# Patient Record
Sex: Male | Born: 1960
Health system: Southern US, Community
[De-identification: ages and names within clinical notes are randomized; demographics above are authoritative.]

## PROBLEM LIST (undated history)

## (undated) DIAGNOSIS — C439 Malignant melanoma of skin, unspecified: Secondary | ICD-10-CM

## (undated) DIAGNOSIS — M1990 Unspecified osteoarthritis, unspecified site: Secondary | ICD-10-CM

## (undated) DIAGNOSIS — N529 Male erectile dysfunction, unspecified: Secondary | ICD-10-CM

## (undated) DIAGNOSIS — C679 Malignant neoplasm of bladder, unspecified: Secondary | ICD-10-CM

## (undated) DIAGNOSIS — I10 Essential (primary) hypertension: Secondary | ICD-10-CM

## (undated) DIAGNOSIS — K219 Gastro-esophageal reflux disease without esophagitis: Secondary | ICD-10-CM

## (undated) DIAGNOSIS — G4730 Sleep apnea, unspecified: Secondary | ICD-10-CM

## (undated) DIAGNOSIS — N400 Enlarged prostate without lower urinary tract symptoms: Secondary | ICD-10-CM

## (undated) HISTORY — DX: Malignant melanoma of skin, unspecified: C43.9

## (undated) HISTORY — DX: Male erectile dysfunction, unspecified: N52.9

## (undated) HISTORY — DX: Sleep apnea, unspecified: G47.30

## (undated) HISTORY — DX: Essential (primary) hypertension: I10

## (undated) HISTORY — PX: KNEE SURGERY: SHX244

## (undated) HISTORY — DX: Benign prostatic hyperplasia without lower urinary tract symptoms: N40.0

## (undated) HISTORY — PX: TONSILLECTOMY AND ADENOIDECTOMY: SUR1326

## (undated) HISTORY — DX: Gastro-esophageal reflux disease without esophagitis: K21.9

## (undated) HISTORY — PX: PILONIDAL CYST EXCISION: SHX744

## (undated) HISTORY — DX: Malignant neoplasm of bladder, unspecified: C67.9

## (undated) HISTORY — PX: SHOULDER SURGERY: SHX246

## (undated) HISTORY — PX: MELANOMA EXCISION: SHX5266

---

## 2004-11-23 ENCOUNTER — Emergency Department (HOSPITAL_COMMUNITY): Admission: EM | Admit: 2004-11-23 | Discharge: 2004-11-23 | Payer: Self-pay | Admitting: Family Medicine

## 2005-09-21 DIAGNOSIS — C679 Malignant neoplasm of bladder, unspecified: Secondary | ICD-10-CM

## 2005-09-21 HISTORY — DX: Malignant neoplasm of bladder, unspecified: C67.9

## 2005-12-28 ENCOUNTER — Encounter (INDEPENDENT_AMBULATORY_CARE_PROVIDER_SITE_OTHER): Payer: Self-pay | Admitting: *Deleted

## 2005-12-28 ENCOUNTER — Ambulatory Visit (HOSPITAL_BASED_OUTPATIENT_CLINIC_OR_DEPARTMENT_OTHER): Admission: RE | Admit: 2005-12-28 | Discharge: 2005-12-28 | Payer: Self-pay | Admitting: Urology

## 2006-04-30 ENCOUNTER — Ambulatory Visit (HOSPITAL_BASED_OUTPATIENT_CLINIC_OR_DEPARTMENT_OTHER): Admission: RE | Admit: 2006-04-30 | Discharge: 2006-04-30 | Payer: Self-pay | Admitting: Urology

## 2006-04-30 ENCOUNTER — Encounter (INDEPENDENT_AMBULATORY_CARE_PROVIDER_SITE_OTHER): Payer: Self-pay | Admitting: Specialist

## 2007-08-01 ENCOUNTER — Ambulatory Visit (HOSPITAL_BASED_OUTPATIENT_CLINIC_OR_DEPARTMENT_OTHER): Admission: RE | Admit: 2007-08-01 | Discharge: 2007-08-01 | Payer: Self-pay | Admitting: Urology

## 2007-08-01 ENCOUNTER — Encounter (INDEPENDENT_AMBULATORY_CARE_PROVIDER_SITE_OTHER): Payer: Self-pay | Admitting: Urology

## 2008-07-20 ENCOUNTER — Encounter (INDEPENDENT_AMBULATORY_CARE_PROVIDER_SITE_OTHER): Payer: Self-pay | Admitting: Urology

## 2008-07-20 ENCOUNTER — Ambulatory Visit (HOSPITAL_BASED_OUTPATIENT_CLINIC_OR_DEPARTMENT_OTHER): Admission: RE | Admit: 2008-07-20 | Discharge: 2008-07-20 | Payer: Self-pay | Admitting: Urology

## 2009-09-03 ENCOUNTER — Ambulatory Visit (HOSPITAL_BASED_OUTPATIENT_CLINIC_OR_DEPARTMENT_OTHER): Admission: RE | Admit: 2009-09-03 | Discharge: 2009-09-03 | Payer: Self-pay | Admitting: Podiatry

## 2010-09-09 ENCOUNTER — Ambulatory Visit
Admission: RE | Admit: 2010-09-09 | Discharge: 2010-09-09 | Payer: Self-pay | Source: Home / Self Care | Attending: Urology | Admitting: Urology

## 2010-12-01 LAB — POCT HEMOGLOBIN-HEMACUE: Hemoglobin: 17.2 g/dL — ABNORMAL HIGH (ref 13.0–17.0)

## 2010-12-23 LAB — POCT HEMOGLOBIN-HEMACUE: Hemoglobin: 16 g/dL (ref 13.0–17.0)

## 2011-02-03 NOTE — Op Note (Signed)
NAMEGERMAIN, Christian Douglas                 ACCOUNT NO.:  [id number]   MEDICAL RECORD NO.:  [id number]          PATIENT TYPE:  AMB   LOCATION:  [facility]                         FACILITY:  [facility]   PHYSICIAN:  [name], M.D.DATE OF BIRTH:  1960/11/13   DATE OF PROCEDURE:  08/01/2007  DATE OF DISCHARGE:                               OPERATIVE REPORT   PREOPERATIVE DIAGNOSIS:  History of carcinoma in situ of the bladder.   POSTOPERATIVE DIAGNOSIS:  1. History of carcinoma in situ of the bladder.  2. Mild urethral meatal stenosis.   PROCEDURE:  Cystoscopy, urethral meatal dilation and cold cup bladder  biopsy.   SURGEON:  Dr. Larey Dresser.   ANESTHESIA:  General.   INDICATIONS:  This 50 year old gentleman has a history of carcinoma in  situ of the bladder discovered in April 2007.  He was then treated with  6 weeks of BCG and the biopsy of the bladder in August 2007 showed  reactive changes.  Follow-up cystoscopies have been unremarkable since  then. He comes in now for follow-up of bladder biopsy to rule out any  recurrent disease.   PROCEDURE:  The patient was brought to the operating room and placed in  lithotomy position. The external genitalia were prepped and draped in  the usual fashion.  He had a mild urethral meatal stenosis which was  dilated and after that the cystoscope was inserted and the anterior  urethra was normal and the prostatic urethra was unremarkable.  The  bladder actually looked quite good with no localized lesions, papillary  tumors or stones.  Random bladder biopsies were taken across the bladder  base and the biopsy sites fulgurated with the Bugbee electrode.  The  bladder was emptied, the scope removed and the patient sent to the  recovery room in good condition having tolerated the procedure well.  Blood loss was zero.      [name], M.D.  Electronically Signed     LJP/MEDQ  D:  08/01/2007  T:  08/01/2007  Job:  [id number]

## 2011-02-03 NOTE — Op Note (Signed)
NAMEEAVEN, SCHWAGER                 ACCOUNT NO.:  [id number]   MEDICAL RECORD NO.:  [id number]          PATIENT TYPE:  AMB   LOCATION:  [facility]                         FACILITY:  [facility]   PHYSICIAN:  [name], M.D.DATE OF BIRTH:  [birth year]   DATE OF PROCEDURE:  07/20/2008  DATE OF DISCHARGE:                               OPERATIVE REPORT   PREOPERATIVE DIAGNOSES:  History of bladder carcinoma.   POSTOPERATIVE DIAGNOSES:  History of bladder carcinoma.   PROCEDURE:  Cystoscopy and cold cup bladder biopsy.   SURGEON:  [name], M.D.   ANESTHESIA:  General.   INDICATIONS:  This gentleman has had bladder carcinoma in situ, going  back to April 2007.  After that, he had six weeks of BCG and a biopsy in  August 2007 showed reactive changes and also in November 2008.  He is  now back in for surveillance cystoscopy and biopsy of the bladder.   DESCRIPTION OF PROCEDURE:  The patient was brought to the operating room  and placed in lithotomy position.  The external genitalia were prepped  and draped in the usual fashion.  He was cystoscoped and there were no  anterior prostatic urethral lesions.  The bladder had just some mild  diffuse increased vascularity with no localized lesions.  There was  scarring from his previous biopsy site.  Ureteral orifices were normal.  Cold cup bladder biopsies were taken across the lower lateral walls and  bladder base and sent to pathology.  The biopsy sites were fulgurated  with the Bugbee electrode.  The bladder was emptied, the scope removed  and the patient sent to the recovery room in good condition, having  tolerated the procedure well.      [name], M.D.  Electronically Signed     LJP/MEDQ  D:  07/20/2008  T:  07/20/2008  Job:  [id number]

## 2011-02-06 NOTE — Op Note (Signed)
NAMESAAGAR, TORTORELLA                 ACCOUNT NO.:  [id number]   MEDICAL RECORD NO.:  [id number]          PATIENT TYPE:  AMB   LOCATION:  [facility]                         FACILITY:  [facility]   PHYSICIAN:  [name], M.D.DATE OF BIRTH:  1961/03/19   DATE OF PROCEDURE:  04/30/2006  DATE OF DISCHARGE:                                 OPERATIVE REPORT   PREOPERATIVE DIAGNOSIS:  History of carcinoma-in-situ of the bladder.   POSTOPERATIVE DIAGNOSIS:  1. History of carcinoma-in-situ of the bladder.  2. Urethral meatal stenosis.   PROCEDURE:  1. Cystoscopy.  2. Urethral dilation.  3. Cold cut bladder biopsy.   SURGEON:  [name], M.D.   ANESTHESIA:  General.   INDICATIONS:  This is a 50 year old gentleman who was found to have  carcinoma-in-situ in April of this year and then underwent 6 weeks of  therapy with BCG and he comes back now for cystoscopy and bladder biopsy to  assess the adequacy of therapy.   PROCEDURE:  The patient was brought to the operating room, placed in the  lithotomy position.  External genitalia were prepped and draped in the usual  fashion.  His urethra meatal area was somewhat tight so I had to perform a  urethral meatal dilation to 28 [place] and then the cystoscope was inserted  without difficulty.  The anterior and prostatic urethras were unremarkable.  The bladder had minimal erythema across the bladder base and no other  abnormalities and no papillary tumors.  No stones were seen.  Cold cut  bladder biopsy forceps was utilized to obtain three random biopsies across  the bladder base and the biopsy sites were then fulgurated with the Bugbee  electrode.  Bladder was emptied, the scope removed and the patient sent to  the recovery room in good condition having tolerated the procedure well with  essentially no blood loss.      [name], M.D.  Electronically Signed     LJP/MEDQ  D:  04/30/2006  T:  04/30/2006  Job:  [id number]

## 2011-02-06 NOTE — Op Note (Signed)
Christian Douglas, Christian Douglas                 ACCOUNT NO.:  [id number]   MEDICAL RECORD NO.:  [id number]          PATIENT TYPE:  AMB   LOCATION:  [facility]                         FACILITY:  [facility]   PHYSICIAN:  [name], M.D.DATE OF BIRTH:  11/28/1960   DATE OF PROCEDURE:  12/28/2005  DATE OF DISCHARGE:                                 OPERATIVE REPORT   PREOPERATIVE DIAGNOSIS:  Microhematuria.   POSTOPERATIVE DIAGNOSIS:  Microhematuria.   PROCEDURE:  Cystoscopy, bilateral retrograde pyelograms with interpretation.  Cold cut bladder biopsy.   SURGEON:  Dr. Larey Dresser   ANESTHESIA:  General.   INDICATIONS:  This 50 year old gentleman has had micro and gross hematuria  and has undergone cystoscopy that revealed some erythematous area on the  bladder that was felt to be some hemorrhagic cystitis at the time. He  returned for follow-up and was still having microhematuria.  I should add  that last year he did have a normal renal ultrasound.  It was felt that he  needed cystoscopy at this point along with possible bladder biopsy and  retrograde pyelograms for completion of his workup.   PROCEDURE:  The patient was brought to the operating room, placed lithotomy  position and external genitalia were prepped and draped in usual fashion.  He was cystoscoped and a mild urethral meatal stricture was dilated. The  anterior urethra was otherwise normal. The prostate was unremarkable.  The  bladder had just a little increased erythema on the bladder base with no  localized lesions. I should add that he did have a few tiny inflammatory  polyps in the prostatic urethra.  There was no acute inflammation and no  papillary tumors.   Bilateral retrograde pyelograms were obtained using a cone-tipped ureteral  catheter inserted cytoscopically and he had no obstruction or filling  defects. Pyelocaliceal systems were normal on the right side at first there  was an air bubble that was cleared with  further injection of contrast and  fluoroscopic visualization and in summary the upper tracts looked normal.   Cold cut bladder biopsies were taken from the bladder base and the biopsy  sites fulgurated with the Bugbee electrode. At this point the bladder was  emptied, scope removed.  The patient sent to recovery room in good condition  having tolerated the procedure well.      [name], M.D.  Electronically Signed     LJP/MEDQ  D:  12/28/2005  T:  12/28/2005  Job:  [id number]

## 2011-06-30 LAB — POCT HEMOGLOBIN-HEMACUE: Operator id: 268271

## 2012-01-13 ENCOUNTER — Ambulatory Visit (HOSPITAL_COMMUNITY)
Admission: RE | Admit: 2012-01-13 | Discharge: 2012-01-13 | Disposition: A | Discharge status: Home / Self Care | Payer: 59 | Source: Ambulatory Visit | Attending: Specialist | Admitting: Specialist

## 2012-01-13 ENCOUNTER — Other Ambulatory Visit (HOSPITAL_COMMUNITY): Payer: Self-pay | Admitting: Specialist

## 2012-01-13 DIAGNOSIS — Z139 Encounter for screening, unspecified: Secondary | ICD-10-CM

## 2012-01-13 DIAGNOSIS — Z1389 Encounter for screening for other disorder: Secondary | ICD-10-CM | POA: Insufficient documentation

## 2012-06-20 ENCOUNTER — Ambulatory Visit (HOSPITAL_COMMUNITY)
Admission: RE | Admit: 2012-06-20 | Discharge: 2012-06-20 | Disposition: A | Discharge status: Home / Self Care | Payer: 59 | Source: Ambulatory Visit | Attending: Specialist | Admitting: Specialist

## 2012-06-20 ENCOUNTER — Other Ambulatory Visit (HOSPITAL_COMMUNITY): Payer: Self-pay | Admitting: Lab

## 2012-06-20 DIAGNOSIS — M25569 Pain in unspecified knee: Secondary | ICD-10-CM

## 2012-06-20 DIAGNOSIS — Z1389 Encounter for screening for other disorder: Secondary | ICD-10-CM | POA: Insufficient documentation

## 2013-10-16 ENCOUNTER — Other Ambulatory Visit: Payer: Self-pay | Admitting: Neurological Surgery

## 2013-10-16 DIAGNOSIS — M5412 Radiculopathy, cervical region: Secondary | ICD-10-CM

## 2013-10-24 ENCOUNTER — Ambulatory Visit
Admission: RE | Admit: 2013-10-24 | Discharge: 2013-10-24 | Disposition: A | Discharge status: Home / Self Care | Payer: 59 | Source: Ambulatory Visit | Attending: Neurological Surgery | Admitting: Neurological Surgery

## 2013-10-24 DIAGNOSIS — M5412 Radiculopathy, cervical region: Secondary | ICD-10-CM

## 2014-08-28 ENCOUNTER — Ambulatory Visit (HOSPITAL_COMMUNITY)
Admission: RE | Admit: 2014-08-28 | Discharge: 2014-08-28 | Disposition: A | Discharge status: Home / Self Care | Payer: 59 | Source: Ambulatory Visit | Attending: Specialist | Admitting: Specialist

## 2014-08-28 ENCOUNTER — Other Ambulatory Visit (HOSPITAL_COMMUNITY): Payer: Self-pay | Admitting: Specialist

## 2014-08-28 DIAGNOSIS — M25561 Pain in right knee: Secondary | ICD-10-CM | POA: Insufficient documentation

## 2014-08-28 DIAGNOSIS — Z01818 Encounter for other preprocedural examination: Secondary | ICD-10-CM | POA: Diagnosis present

## 2015-02-25 ENCOUNTER — Telehealth: Payer: Self-pay

## 2015-02-25 NOTE — Telephone Encounter (Signed)
I called and left message for patient to see if he can move his appt to 8:30 on Friday due to a family event for Dr. Rexene Alberts. I gave him my name and number for Jovanne to call back.

## 2015-02-25 NOTE — Telephone Encounter (Signed)
Patient called/retruning Christian Douglas's call. Please call and advise. Patient can be reached @ [phone]

## 2015-02-25 NOTE — Telephone Encounter (Signed)
Patient called back but I was unable to catch his call. I left message to call back.

## 2015-02-25 NOTE — Telephone Encounter (Signed)
Patient called back and was connected with Christian Douglas.

## 2015-02-26 NOTE — Telephone Encounter (Signed)
I spoke to patient, he may try to come in a little early on Friday.

## 2015-03-01 ENCOUNTER — Ambulatory Visit (INDEPENDENT_AMBULATORY_CARE_PROVIDER_SITE_OTHER): Payer: 59 | Admitting: Neurology

## 2015-03-01 ENCOUNTER — Encounter: Payer: Self-pay | Admitting: Neurology

## 2015-03-01 VITALS — BP 132/82 | HR 88 | Resp 18 | Ht 71.0 in | Wt 336.0 lb

## 2015-03-01 DIAGNOSIS — G2581 Restless legs syndrome: Secondary | ICD-10-CM | POA: Diagnosis not present

## 2015-03-01 DIAGNOSIS — R351 Nocturia: Secondary | ICD-10-CM

## 2015-03-01 DIAGNOSIS — G4734 Idiopathic sleep related nonobstructive alveolar hypoventilation: Secondary | ICD-10-CM | POA: Diagnosis not present

## 2015-03-01 DIAGNOSIS — G4733 Obstructive sleep apnea (adult) (pediatric): Secondary | ICD-10-CM

## 2015-03-01 DIAGNOSIS — G4761 Periodic limb movement disorder: Secondary | ICD-10-CM

## 2015-03-01 NOTE — Progress Notes (Signed)
Subjective:    Patient ID: Christian Douglas is a 54 y.o. male.  HPI     Christian Age, MD, PhD [facility] [address] [place], [place] [address]  Dear Christian Douglas,   I saw your patient, Christian Douglas, upon your kind request in my neurologic clinic today for initial consultation of his sleep disorder, in particular, reevaluation of his obstructive sleep apnea. The patient is unaccompanied today. As you know, Christian Douglas is a 54 year old right-handed gentleman with an underlying medical history of morbid obesity, hypertension, reflux disease, BPH, erectile dysfunction, history of melanoma, history of bladder cancer, diagnosed in 2007, status post BCG treatment, status post tonsillectomy, adenoidectomy and pilonidal cyst removal, who was diagnosed with obstructive sleep apnea several years ago. I reviewed your office date from 02/04/2015 which you kindly included. I reviewed his prior sleep test reports as well: He had a baseline sleep study on 08/31/2008 at the [facility], which I reviewed: His AHI was 88 per hour. Average oxygen saturation was 86, nadir was 65. He was noted to have PVCs. Snoring was rated as severe. He had no significant PLMS. Sleep efficiency was 82%. He had a CPAP titration study at the [facility] on 09/18/2008, which I reviewed: CPAP was was titrated to 15 cm. His AHI was reduced to 0 per hour. He had no significant PLMS. Sleep efficiency was 68%. He was provided with a large nasal mask. BMI at the time was 44. He then had a BiPAP titration study at the [facility] on 06/14/2009, which I reviewed. He was titrated on BiPAP up to a pressure of 22/18. His residual AHI was 4.2 per hour at the final pressure. He had no significant PLMS. Sleep efficiency was 75%. He was provided with a fit life full facemask size large. All his 3 sleep studies were interpreted by Christian Douglas. His bedtime and  wake time varies. He works 12 hour shifts and these shifts can vary. Most of his shifts are daytime hours but he can have some nighttime shifts. He often works 7 days a week. He is a never smoker. He quit drinking alcohol in 1999. He drinks 1-2 cans of soda. His Epworth sleepiness score is 12 out of 24. His weight has been fluctuating. He is trying to lose weight and has started drinking more water. He has had some nighttime leg cramps. He does have some restless leg symptoms and his girlfriend says that he twitches and kicks in his sleep at times. He has to get up to use the bathroom approximately once per night. He does not have morning headaches. He lives alone but girlfriend stays over. His parents are in their 78s. He has no obvious family history of obstructive sleep apnea. His main symptom with regards to his sleep is nonrestorative sleep and daytime tiredness as well as inability to tolerate BiPAP at this higher pressure. He is currently using nasal pillows. He did not like the full facemask. A nasal mask was difficult to get a seal on because of his facial hair. He uses BiPAP regularly but typically does not tolerate it for more than 2-1/2-3 hours.  His Past Medical History Is Significant For: Past Medical History  Diagnosis Date  . Sleep apnea   . Hypertension   . GERD (gastroesophageal reflux disease)   . Benign prostatic hypertrophy   . ED (erectile dysfunction)   . Melanoma   . Bladder  cancer 2007    His Past Surgical History Is Significant For: Past Surgical History  Procedure Laterality Date  . Tonsillectomy and adenoidectomy    . Pilonidal cyst excision      His Family History Is Significant For: Family History  Problem Relation Douglas of Onset  . Osteoarthritis Mother   . Atrial fibrillation Father   . CAD Father   . Diabetes Sister   . Osteoarthritis Brother     His Social History Is Significant For: History   Social History  . Marital Status: Divorced    Spouse  Name: N/A  . Number of Children: 0  . Years of Education: College   Occupational History  . ADJUSTER     [employer]   Social History Main Topics  . Smoking status: Never Smoker   . Smokeless tobacco: Not on file  . Alcohol Use: No  . Drug Use: No  . Sexual Activity: Not on file   Other Topics Concern  . None   Social History Narrative   Consumes 2 sodas a day     His Allergies Are:  Allergies  Allergen Reactions  . Nasonex [Mometasone Furoate]   :   His Current Medications Are:  Outpatient Encounter Prescriptions as of 03/01/2015  Medication Sig  . calcium-vitamin D (OSCAL WITH D) 250-125 MG-UNIT per tablet Take 1 tablet by mouth daily.  . Garlic 10 MG CAPS Take by mouth.  Marland Kitchen HYDROcodone-acetaminophen (NORCO/VICODIN) 5-325 MG per tablet Take 1 tablet by mouth every 4 (four) hours as needed for moderate pain.  . magnesium 30 MG tablet Take 30 mg by mouth 2 (two) times daily.  . Multiple Vitamin (MULTIVITAMIN) capsule Take 1 capsule by mouth daily.  . pantoprazole (PROTONIX) 40 MG tablet Take 40 mg by mouth daily.  . tadalafil (CIALIS) 20 MG tablet Take 20 mg by mouth daily as needed for erectile dysfunction.  . vitamin B-12 (CYANOCOBALAMIN) 100 MCG tablet Take 100 mcg by mouth daily.   No facility-administered encounter medications on file as of 03/01/2015.  :  Review of Systems:  Out of a complete 14 point review of systems, all are reviewed and negative with the exception of these symptoms as listed below:   Review of Systems  Constitutional: Positive for fatigue.       Weight gain   Neurological:       Works 12hrs 7 days a week, Restless legs, Snoring, witnessed apnea, home pulse ox reported 70% during sleep, wakes up feeling tired in the morning, daytime tiredness, falls asleep when sitting down. No trouble falling asleep, states he has trouble staying asleep. Placed on CPAP around 2010, stopped using it for 2-3 years, restarted about 4-6 weeks ago.     Objective:   Neurologic Exam  Physical Exam Physical Examination:   Filed Vitals:   03/01/15 0900  BP: 132/82  Pulse: 88  Resp: 18    General Examination: The patient is a very pleasant 54 y.o. male in no acute distress. He appears well-developed and well-nourished and well groomed.   HEENT: Normocephalic, atraumatic, pupils are equal, round and reactive to light and accommodation. Funduscopic exam is normal with sharp disc margins noted. Extraocular tracking is good without limitation to gaze excursion or nystagmus noted. Normal smooth pursuit is noted. Hearing is grossly intact. Tympanic membranes are clear bilaterally. Face is symmetric with normal facial animation and normal facial sensation. Speech is clear with no dysarthria noted. There is no hypophonia. There is no lip, neck/head,  jaw or voice tremor. Neck is supple with full range of passive and active motion. There are no carotid bruits on auscultation. Oropharynx exam reveals: mild mouth dryness, adequate dental hygiene and moderate airway crowding, due to thicker soft palate, large uvula and larger tongue. Tonsils are absent. Mallampati is class III. Tongue protrudes centrally and palate elevates symmetrically. Neck size is 18 1/8 inches. He has a Absent overbite. Nasal inspection reveals no significant nasal mucosal bogginess or redness and no septal deviation.   Chest: Clear to auscultation without wheezing, rhonchi or crackles noted.  Heart: S1+S2+0, regular and normal without murmurs, rubs or gallops noted.   Abdomen: Soft, non-tender and non-distended with normal bowel sounds appreciated on auscultation.  Extremities: There is no pitting edema in the distal lower extremities bilaterally. Pedal pulses are intact.  Skin: Warm and dry without trophic changes noted. There are no varicose veins.  Musculoskeletal: exam reveals no obvious joint deformities, tenderness or joint swelling or erythema.   Neurologically:  Mental status: The  patient is awake, alert and oriented in all 4 spheres. His immediate and remote memory, attention, language skills and fund of knowledge are appropriate. There is no evidence of aphasia, agnosia, apraxia or anomia. Speech is clear with normal prosody and enunciation. Thought process is linear. Mood is normal and affect is normal.  Cranial nerves II - XII are as described above under HEENT exam. In addition: shoulder shrug is normal with equal shoulder height noted. Motor exam: Normal bulk, strength and tone is noted. There is no drift, tremor or rebound. Romberg is negative. Reflexes are 2+ throughout. Fine motor skills and coordination: intact with normal finger taps, normal hand movements, normal rapid alternating patting, normal foot taps and normal foot agility.  Cerebellar testing: No dysmetria or intention tremor on finger to nose testing. Heel to shin is unremarkable bilaterally. There is no truncal or gait ataxia.  Sensory exam: intact to light touch, pinprick, vibration, temperature sense in the upper and lower extremities.  Gait, station and balance: He stands easily. No veering to one side is noted. No leaning to one side is noted. Posture is Douglas-appropriate and stance is narrow based. Gait shows normal stride length and normal pace. No problems turning are noted. He turns en bloc. Tandem walk is slightly difficult for him.     Assessment and Plan:   In summary, Christian Douglas is a very pleasant 54 y.o.-year old male with an underlying medical history of morbid obesity, hypertension, reflux disease, BPH, erectile dysfunction, history of melanoma, history of bladder cancer, diagnosed in 2007, status post BCG treatment, status post tonsillectomy, adenoidectomy and pilonidal cyst removal, who was diagnosed with severe obstructive sleep apnea in 2009 with severe desaturations noted. He had evidence of nocturnal hypoxemia. He was first treated with CPAP which was difficult to tolerate because of the  high pressure requirement and was then placed on BiPAP but the pressure is still quite high. He has difficulty tolerating BiPAP but overall felt better with BiPAP given with CPAP. His main concern is inability to sleep through the night with BiPAP and residual daytime somnolence. In addition, he has nocturia, restless leg symptoms and a history of leg twitching at night and keeping with PLMD. At this juncture, I would like to invite him back for reevaluation with a split-night sleep study to help diagnose the underlying severely of obstructive sleep apnea at this point since it has been several years and to help optimize his treatment. Because of high  pressure requirement he may still benefit from BiPAP over CPAP therapy.  I had a long chat with the patient about my findings and the diagnosis of OSA, its prognosis and treatment options. We talked about medical treatments, surgical interventions and non-pharmacological approaches. I explained in particular the risks and ramifications of untreated moderate to severe OSA, especially with respect to developing cardiovascular disease down the Road, including congestive heart failure, difficult to treat hypertension, cardiac arrhythmias, or stroke. Even type 2 diabetes has, in part, been linked to untreated OSA. Symptoms of untreated OSA include daytime sleepiness, memory problems, mood irritability and mood disorder such as depression and anxiety, lack of energy, as well as recurrent headaches, especially morning headaches. We talked about trying to maintain a healthy lifestyle in general, as well as the importance of weight control. I encouraged the patient to eat healthy, exercise daily and keep well hydrated, to keep a scheduled bedtime and wake time routine, to not skip any meals and eat healthy snacks in between meals. I advised the patient not to drive when feeling sleepy. I recommended the following at this time: sleep study with potential positive airway  pressure titration. (We will score hypopneas at 4% and split the sleep study into diagnostic and treatment portion, if the estimated. 2 hour AHI is >20/h).   I explained the sleep test procedure to the patient and also outlined possible surgical and non-surgical treatment options of OSA, including the use of a custom-made dental device (which would require a referral to a specialist dentist or oral surgeon), upper airway surgical options, such as pillar implants, radiofrequency surgery, tongue base surgery, and UPPP (which would involve a referral to an ENT surgeon). Rarely, jaw surgery such as mandibular advancement may be considered.  I also explained the CPAP treatment option to the patient, who indicated that he would be willing to try CPAP or BiPAP if the need arises. I explained the importance of being compliant with PAP treatment, not only for insurance purposes but primarily to improve His symptoms, and for the patient's long term health benefit, including to reduce His cardiovascular risks. I answered all his questions today and the patient was in agreement. I would like to see him back after the sleep study is completed and encouraged him to call with any interim questions, concerns, problems or updates.   Thank you very much for allowing me to participate in the care of this nice patient. If I can be of any further assistance to you please do not hesitate to call me at [phone].  Sincerely,   Christian Age, MD, PhD

## 2015-03-01 NOTE — Patient Instructions (Signed)
Based on your history and exam you have severe OSA, and I think we should proceed with a sleep study to determine how severe it is. If you continue to have moderate to severe OSA, I want you to consider treatment with CPAP or BiPAP. Please remember, the risks and ramifications of moderate to severe obstructive sleep apnea or OSA are: Cardiovascular disease, including congestive heart failure, stroke, difficult to control hypertension, arrhythmias, and even type 2 diabetes has been linked to untreated OSA. Sleep apnea causes disruption of sleep and sleep deprivation in most cases, which, in turn, can cause recurrent headaches, problems with memory, mood, concentration, focus, and vigilance. Most people with untreated sleep apnea report excessive daytime sleepiness, which can affect their ability to drive. Please do not drive if you feel sleepy.  I will see you back after your sleep study to go over the test results and where to go from there. We will call you after your sleep study and to set up an appointment at the time.   Our sleep lab administrative assistant, Angelina Sheriff will call you to schedule your sleep study. If you don't hear back from her by next week please feel free to call her at [phone]. This is her direct line and please leave a message with your phone number to call back if you get the voicemail box.

## 2015-08-19 ENCOUNTER — Ambulatory Visit (HOSPITAL_COMMUNITY)
Admission: RE | Admit: 2015-08-19 | Discharge: 2015-08-19 | Disposition: A | Discharge status: Home / Self Care | Payer: 59 | Source: Ambulatory Visit | Attending: Specialist | Admitting: Specialist

## 2015-08-19 ENCOUNTER — Other Ambulatory Visit (HOSPITAL_COMMUNITY): Payer: Self-pay | Admitting: Specialist

## 2015-08-19 DIAGNOSIS — M25562 Pain in left knee: Secondary | ICD-10-CM

## 2015-08-19 DIAGNOSIS — Z01818 Encounter for other preprocedural examination: Secondary | ICD-10-CM | POA: Insufficient documentation

## 2016-09-24 DIAGNOSIS — G4730 Sleep apnea, unspecified: Secondary | ICD-10-CM | POA: Diagnosis not present

## 2016-09-24 DIAGNOSIS — K219 Gastro-esophageal reflux disease without esophagitis: Secondary | ICD-10-CM | POA: Diagnosis not present

## 2016-09-24 DIAGNOSIS — I10 Essential (primary) hypertension: Secondary | ICD-10-CM | POA: Diagnosis not present

## 2016-10-15 DIAGNOSIS — D485 Neoplasm of uncertain behavior of skin: Secondary | ICD-10-CM | POA: Diagnosis not present

## 2016-10-20 DIAGNOSIS — G4733 Obstructive sleep apnea (adult) (pediatric): Secondary | ICD-10-CM | POA: Diagnosis not present

## 2016-11-08 DIAGNOSIS — G4733 Obstructive sleep apnea (adult) (pediatric): Secondary | ICD-10-CM | POA: Diagnosis not present

## 2016-11-12 DIAGNOSIS — I10 Essential (primary) hypertension: Secondary | ICD-10-CM | POA: Diagnosis not present

## 2016-11-12 DIAGNOSIS — Z23 Encounter for immunization: Secondary | ICD-10-CM | POA: Diagnosis not present

## 2016-11-12 DIAGNOSIS — Z0000 Encounter for general adult medical examination without abnormal findings: Secondary | ICD-10-CM | POA: Diagnosis not present

## 2016-11-24 DIAGNOSIS — G4733 Obstructive sleep apnea (adult) (pediatric): Secondary | ICD-10-CM | POA: Diagnosis not present

## 2016-12-02 DIAGNOSIS — I10 Essential (primary) hypertension: Secondary | ICD-10-CM | POA: Diagnosis not present

## 2016-12-03 DIAGNOSIS — R42 Dizziness and giddiness: Secondary | ICD-10-CM | POA: Diagnosis not present

## 2016-12-03 DIAGNOSIS — J309 Allergic rhinitis, unspecified: Secondary | ICD-10-CM | POA: Diagnosis not present

## 2016-12-17 DIAGNOSIS — H9042 Sensorineural hearing loss, unilateral, left ear, with unrestricted hearing on the contralateral side: Secondary | ICD-10-CM | POA: Diagnosis not present

## 2016-12-30 DIAGNOSIS — G4733 Obstructive sleep apnea (adult) (pediatric): Secondary | ICD-10-CM | POA: Diagnosis not present

## 2017-01-11 DIAGNOSIS — R42 Dizziness and giddiness: Secondary | ICD-10-CM | POA: Diagnosis not present

## 2017-01-11 DIAGNOSIS — H8110 Benign paroxysmal vertigo, unspecified ear: Secondary | ICD-10-CM | POA: Diagnosis not present

## 2017-01-18 DIAGNOSIS — M4712 Other spondylosis with myelopathy, cervical region: Secondary | ICD-10-CM | POA: Diagnosis not present

## 2017-01-18 DIAGNOSIS — M542 Cervicalgia: Secondary | ICD-10-CM | POA: Diagnosis not present

## 2017-01-21 DIAGNOSIS — M542 Cervicalgia: Secondary | ICD-10-CM | POA: Diagnosis not present

## 2017-01-21 DIAGNOSIS — M4712 Other spondylosis with myelopathy, cervical region: Secondary | ICD-10-CM | POA: Diagnosis not present

## 2017-01-26 DIAGNOSIS — M542 Cervicalgia: Secondary | ICD-10-CM | POA: Diagnosis not present

## 2017-01-26 DIAGNOSIS — M4712 Other spondylosis with myelopathy, cervical region: Secondary | ICD-10-CM | POA: Diagnosis not present

## 2017-01-28 DIAGNOSIS — M542 Cervicalgia: Secondary | ICD-10-CM | POA: Diagnosis not present

## 2017-01-28 DIAGNOSIS — M4712 Other spondylosis with myelopathy, cervical region: Secondary | ICD-10-CM | POA: Diagnosis not present

## 2017-01-29 DIAGNOSIS — G4733 Obstructive sleep apnea (adult) (pediatric): Secondary | ICD-10-CM | POA: Diagnosis not present

## 2017-02-02 DIAGNOSIS — M542 Cervicalgia: Secondary | ICD-10-CM | POA: Diagnosis not present

## 2017-02-02 DIAGNOSIS — M4712 Other spondylosis with myelopathy, cervical region: Secondary | ICD-10-CM | POA: Diagnosis not present

## 2017-02-04 DIAGNOSIS — M4712 Other spondylosis with myelopathy, cervical region: Secondary | ICD-10-CM | POA: Diagnosis not present

## 2017-02-04 DIAGNOSIS — M542 Cervicalgia: Secondary | ICD-10-CM | POA: Diagnosis not present

## 2017-02-09 DIAGNOSIS — M542 Cervicalgia: Secondary | ICD-10-CM | POA: Diagnosis not present

## 2017-02-09 DIAGNOSIS — M4712 Other spondylosis with myelopathy, cervical region: Secondary | ICD-10-CM | POA: Diagnosis not present

## 2017-02-11 DIAGNOSIS — R7301 Impaired fasting glucose: Secondary | ICD-10-CM | POA: Diagnosis not present

## 2017-02-11 DIAGNOSIS — I10 Essential (primary) hypertension: Secondary | ICD-10-CM | POA: Diagnosis not present

## 2017-02-11 DIAGNOSIS — M4712 Other spondylosis with myelopathy, cervical region: Secondary | ICD-10-CM | POA: Diagnosis not present

## 2017-02-11 DIAGNOSIS — M542 Cervicalgia: Secondary | ICD-10-CM | POA: Diagnosis not present

## 2017-02-16 DIAGNOSIS — M25512 Pain in left shoulder: Secondary | ICD-10-CM | POA: Diagnosis not present

## 2017-02-16 DIAGNOSIS — M542 Cervicalgia: Secondary | ICD-10-CM | POA: Diagnosis not present

## 2017-02-16 DIAGNOSIS — M19012 Primary osteoarthritis, left shoulder: Secondary | ICD-10-CM | POA: Diagnosis not present

## 2017-02-16 DIAGNOSIS — M25712 Osteophyte, left shoulder: Secondary | ICD-10-CM | POA: Diagnosis not present

## 2017-02-16 DIAGNOSIS — M4712 Other spondylosis with myelopathy, cervical region: Secondary | ICD-10-CM | POA: Diagnosis not present

## 2017-02-16 DIAGNOSIS — G8929 Other chronic pain: Secondary | ICD-10-CM | POA: Diagnosis not present

## 2017-02-17 ENCOUNTER — Other Ambulatory Visit: Payer: Self-pay | Admitting: Orthopedic Surgery

## 2017-02-17 DIAGNOSIS — R52 Pain, unspecified: Secondary | ICD-10-CM

## 2017-02-24 ENCOUNTER — Ambulatory Visit
Admission: RE | Admit: 2017-02-24 | Discharge: 2017-02-24 | Disposition: A | Discharge status: Home / Self Care | Payer: 59 | Source: Ambulatory Visit | Attending: Orthopedic Surgery | Admitting: Orthopedic Surgery

## 2017-02-24 ENCOUNTER — Other Ambulatory Visit: Payer: Self-pay | Admitting: Orthopedic Surgery

## 2017-02-24 DIAGNOSIS — R52 Pain, unspecified: Secondary | ICD-10-CM

## 2017-02-24 DIAGNOSIS — Z77018 Contact with and (suspected) exposure to other hazardous metals: Secondary | ICD-10-CM

## 2017-02-24 DIAGNOSIS — Z01818 Encounter for other preprocedural examination: Secondary | ICD-10-CM | POA: Diagnosis not present

## 2017-02-25 ENCOUNTER — Ambulatory Visit
Admission: RE | Admit: 2017-02-25 | Discharge: 2017-02-25 | Disposition: A | Discharge status: Home / Self Care | Payer: 59 | Source: Ambulatory Visit | Attending: Orthopedic Surgery | Admitting: Orthopedic Surgery

## 2017-02-25 DIAGNOSIS — R52 Pain, unspecified: Secondary | ICD-10-CM

## 2017-03-03 DIAGNOSIS — M75112 Incomplete rotator cuff tear or rupture of left shoulder, not specified as traumatic: Secondary | ICD-10-CM | POA: Diagnosis not present

## 2017-03-03 DIAGNOSIS — M19012 Primary osteoarthritis, left shoulder: Secondary | ICD-10-CM | POA: Diagnosis not present

## 2017-03-09 DIAGNOSIS — M25512 Pain in left shoulder: Secondary | ICD-10-CM | POA: Diagnosis not present

## 2017-03-09 DIAGNOSIS — G8929 Other chronic pain: Secondary | ICD-10-CM | POA: Diagnosis not present

## 2017-03-17 DIAGNOSIS — M94212 Chondromalacia, left shoulder: Secondary | ICD-10-CM | POA: Diagnosis not present

## 2017-03-17 DIAGNOSIS — M7502 Adhesive capsulitis of left shoulder: Secondary | ICD-10-CM | POA: Diagnosis not present

## 2017-03-17 DIAGNOSIS — M7542 Impingement syndrome of left shoulder: Secondary | ICD-10-CM | POA: Diagnosis not present

## 2017-03-17 DIAGNOSIS — M19012 Primary osteoarthritis, left shoulder: Secondary | ICD-10-CM | POA: Diagnosis not present

## 2017-03-17 DIAGNOSIS — S43432A Superior glenoid labrum lesion of left shoulder, initial encounter: Secondary | ICD-10-CM | POA: Diagnosis not present

## 2017-03-17 DIAGNOSIS — G8918 Other acute postprocedural pain: Secondary | ICD-10-CM | POA: Diagnosis not present

## 2017-03-23 DIAGNOSIS — M25512 Pain in left shoulder: Secondary | ICD-10-CM | POA: Diagnosis not present

## 2017-03-25 DIAGNOSIS — M25512 Pain in left shoulder: Secondary | ICD-10-CM | POA: Diagnosis not present

## 2017-03-30 DIAGNOSIS — M25512 Pain in left shoulder: Secondary | ICD-10-CM | POA: Diagnosis not present

## 2017-04-01 DIAGNOSIS — M25512 Pain in left shoulder: Secondary | ICD-10-CM | POA: Diagnosis not present

## 2017-04-06 DIAGNOSIS — M25512 Pain in left shoulder: Secondary | ICD-10-CM | POA: Diagnosis not present

## 2017-04-08 DIAGNOSIS — M25512 Pain in left shoulder: Secondary | ICD-10-CM | POA: Diagnosis not present

## 2017-04-13 DIAGNOSIS — M25512 Pain in left shoulder: Secondary | ICD-10-CM | POA: Diagnosis not present

## 2017-04-20 DIAGNOSIS — M25512 Pain in left shoulder: Secondary | ICD-10-CM | POA: Diagnosis not present

## 2017-04-22 DIAGNOSIS — M25512 Pain in left shoulder: Secondary | ICD-10-CM | POA: Diagnosis not present

## 2017-04-27 DIAGNOSIS — M25512 Pain in left shoulder: Secondary | ICD-10-CM | POA: Diagnosis not present

## 2017-04-29 DIAGNOSIS — M25512 Pain in left shoulder: Secondary | ICD-10-CM | POA: Diagnosis not present

## 2017-05-04 DIAGNOSIS — M25512 Pain in left shoulder: Secondary | ICD-10-CM | POA: Diagnosis not present

## 2017-05-31 DIAGNOSIS — K219 Gastro-esophageal reflux disease without esophagitis: Secondary | ICD-10-CM | POA: Diagnosis not present

## 2017-05-31 DIAGNOSIS — R7301 Impaired fasting glucose: Secondary | ICD-10-CM | POA: Diagnosis not present

## 2017-05-31 DIAGNOSIS — I10 Essential (primary) hypertension: Secondary | ICD-10-CM | POA: Diagnosis not present

## 2017-06-24 DIAGNOSIS — J0100 Acute maxillary sinusitis, unspecified: Secondary | ICD-10-CM | POA: Diagnosis not present

## 2017-10-06 DIAGNOSIS — Z8551 Personal history of malignant neoplasm of bladder: Secondary | ICD-10-CM | POA: Diagnosis not present

## 2017-11-30 DIAGNOSIS — Z8551 Personal history of malignant neoplasm of bladder: Secondary | ICD-10-CM | POA: Diagnosis not present

## 2017-12-06 DIAGNOSIS — Z8551 Personal history of malignant neoplasm of bladder: Secondary | ICD-10-CM | POA: Diagnosis not present

## 2017-12-16 DIAGNOSIS — E7800 Pure hypercholesterolemia, unspecified: Secondary | ICD-10-CM | POA: Diagnosis not present

## 2017-12-16 DIAGNOSIS — I10 Essential (primary) hypertension: Secondary | ICD-10-CM | POA: Diagnosis not present

## 2017-12-16 DIAGNOSIS — E119 Type 2 diabetes mellitus without complications: Secondary | ICD-10-CM | POA: Diagnosis not present

## 2018-02-23 DIAGNOSIS — J0100 Acute maxillary sinusitis, unspecified: Secondary | ICD-10-CM | POA: Diagnosis not present

## 2018-03-10 DIAGNOSIS — G8929 Other chronic pain: Secondary | ICD-10-CM | POA: Diagnosis not present

## 2018-03-10 DIAGNOSIS — J342 Deviated nasal septum: Secondary | ICD-10-CM | POA: Diagnosis not present

## 2018-03-10 DIAGNOSIS — R51 Headache: Secondary | ICD-10-CM | POA: Diagnosis not present

## 2018-05-05 DIAGNOSIS — R21 Rash and other nonspecific skin eruption: Secondary | ICD-10-CM | POA: Diagnosis not present

## 2018-05-05 DIAGNOSIS — B356 Tinea cruris: Secondary | ICD-10-CM | POA: Diagnosis not present

## 2018-07-19 IMAGING — CR DG ORBITS FOR FOREIGN BODY
2 series · 2 of 2 positions shown · non-contrast
Comparison: None.

CLINICAL DATA: Metal working/exposure; clearance prior to MRI

EXAM:
ORBITS FOR FOREIGN BODY - 2 VIEW

[w orbit pa (1 of 2)]
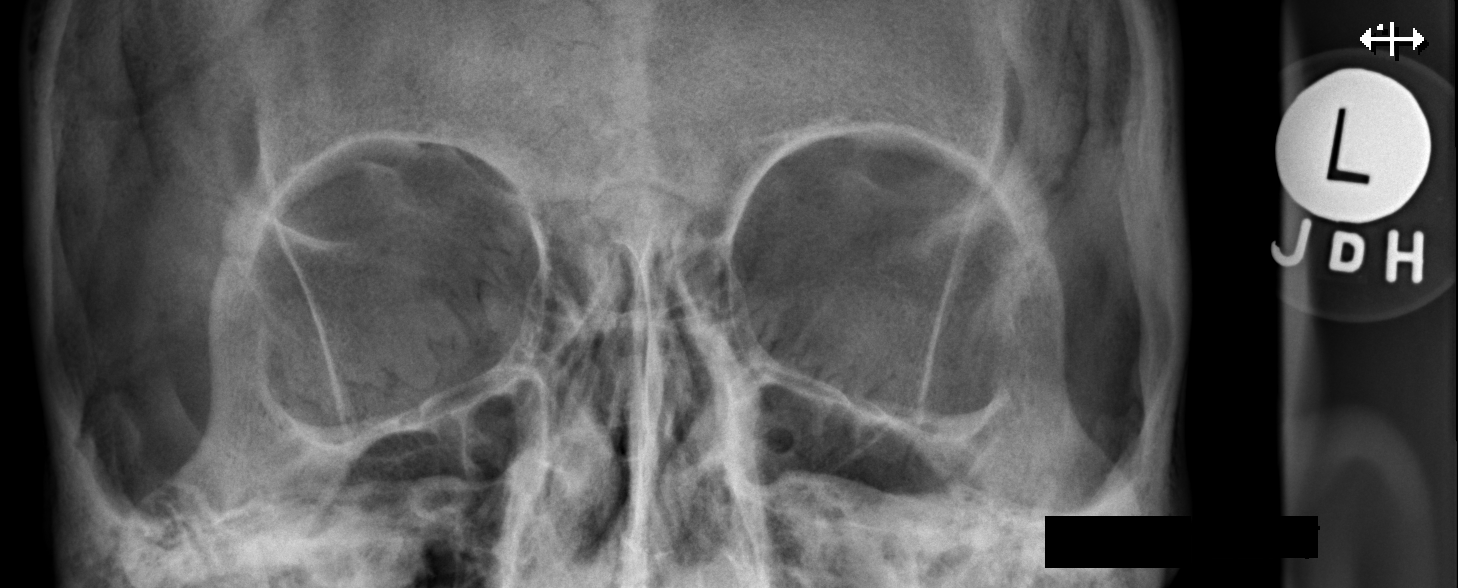

[w orbit pa (2 of 2)]
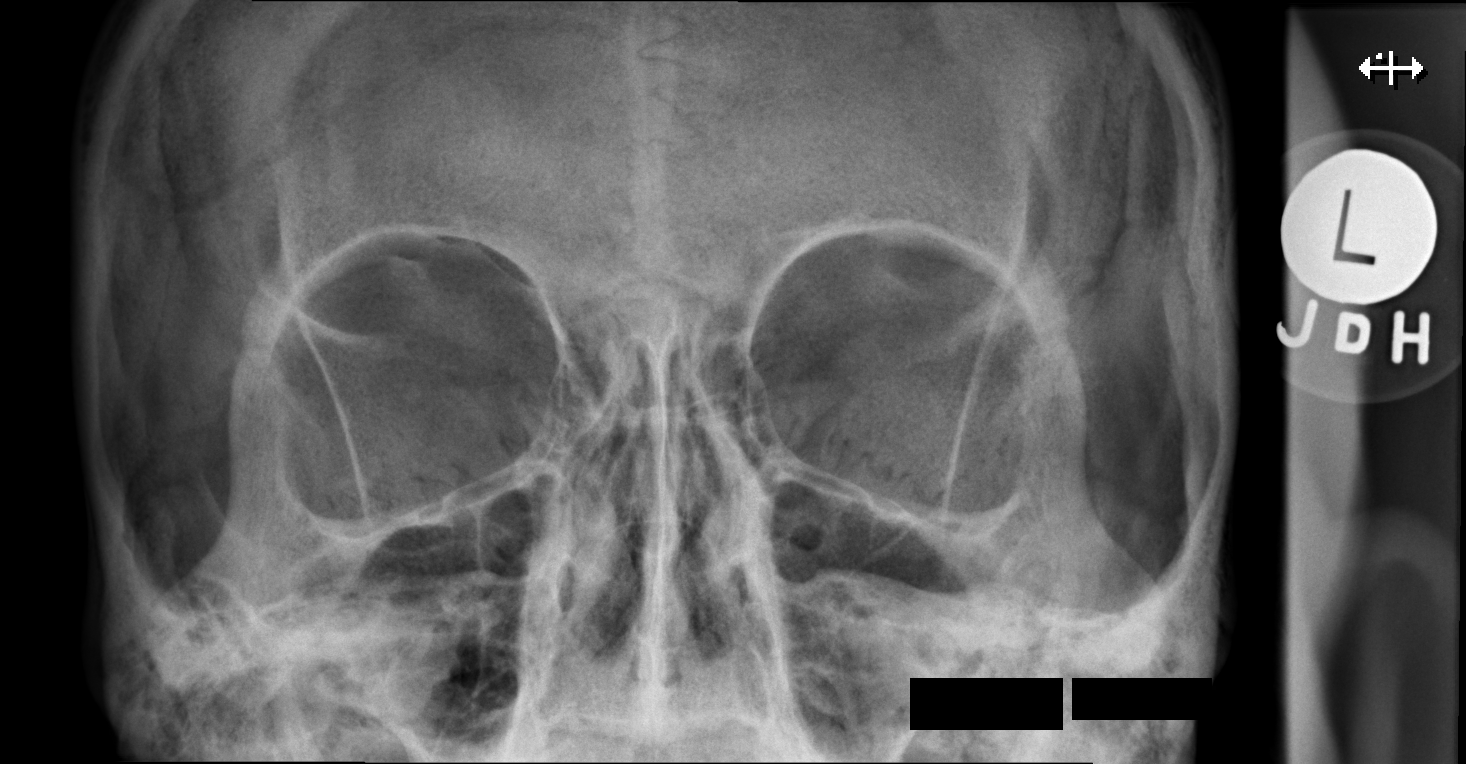

[2 of 2 positions shown; findings below may reference images not displayed]

FINDINGS: There is no evidence of metallic foreign body within the orbits. No
significant bone abnormality identified.
IMPRESSION: No evidence of metallic foreign body within the orbits.

## 2019-08-20 NOTE — Progress Notes (Signed)
Cardiology Office Note:    Date:  08/21/2019   ID:  ASAF MICCICHE, DOB Sep 30, 1960, MRN [id number]  PCP:  Greig Right, MD  Cardiologist:  Shirlee More, MD   Referring MD: Greig Right, MD  ASSESSMENT:    1. Chest pain of uncertain etiology   2. Essential hypertension   3. Obstructive sleep apnea   4. Obesity, Class III, BMI 40-49.9 (morbid obesity) ([place])    PLAN:    In order of problems listed above:  1. Single episode nonanginal chest pain further evaluation echocardiogram for hypertensive heart disease and assess his aortic root and ascending aorta for aortopathy and myocardial perfusion study.  If abnormal will require left heart catheterization angiography and if he has flow-limiting stenosis revascularization. 2. Stage II untreated start an ARB he did not does not want to take a diuretic he also has home blood pressures and bring a list at next visit 3. Untreated, strongly encouraged weight loss and told me likely needs bariatric surgery  Next appointment 3 weeks  Medication Adjustments/Labs and Tests Ordered: Current medicines are reviewed at length with the patient today.  Concerns regarding medicines are outlined above.  No orders of the defined types were placed in this encounter.  No orders of the defined types were placed in this encounter.    Chief Complaint  Patient presents with  . Chest Pain    History of Present Illness:    Christian Douglas is a 58 y.o. male who is being seen today for the evaluation of chest pain at the request of the patient.  He has a history of hypertension does not take antihypertensive agents he has marked obesity has gained about 70 to 80 pounds in the last few years after weight loss and has untreated sleep apnea been CPAP intolerant.  About a week ago he had 6 to 8 hours of aching left chest nonexertional perhaps a little better with rest but not resolved no radiation pain was not severe not pleuritic no chest trauma nausea  vomiting or diaphoresis no associated respiratory symptoms fever chills cough or shortness of breath.  He has had no recurrence.  He is worried about whether or not he has heart disease.  His EKG does not show infarction.  I discussed with him he has significant hypertension stage II and he agreed to restart an ARB but not a diuretic as he says is because cramping in the past.  Strongly encouraged weight loss especially with his untreated sleep apnea and BMI of greater than 40 and told he may require bariatric surgery.  With his episode of chest pain further evaluation including Lexiscan myocardial perfusion study echocardiogram for hypertensive heart disease and follow-up in the office in 3 weeks he has a blood pressure cuff digital with alarm or large arm cuff and record daily and bring it with him.  He understands the importance of sodium restriction.  He has no known history of congenital rheumatic heart disease.   Past Medical History:  Diagnosis Date  . Benign prostatic hypertrophy   . Bladder cancer ([place]) 2007  . ED (erectile dysfunction)   . GERD (gastroesophageal reflux disease)   . Hypertension   . Melanoma ([place])   . Sleep apnea     Past Surgical History:  Procedure Laterality Date  . KNEE SURGERY    . MELANOMA EXCISION    . PILONIDAL CYST EXCISION    . SHOULDER SURGERY    . TONSILLECTOMY AND ADENOIDECTOMY  Current Medications: Current Meds  Medication Sig  . calcium-vitamin D (OSCAL WITH D) 250-125 MG-UNIT per tablet Take 1 tablet by mouth daily.  . Garlic 10 MG CAPS Take by mouth.  . magnesium 30 MG tablet Take 30 mg by mouth 2 (two) times daily.  . Multiple Vitamin (MULTIVITAMIN) capsule Take 1 capsule by mouth daily.  . pantoprazole (PROTONIX) 40 MG tablet Take 40 mg by mouth daily.  . tadalafil (CIALIS) 20 MG tablet Take 20 mg by mouth daily as needed for erectile dysfunction.  . vitamin B-12 (CYANOCOBALAMIN) 100 MCG tablet Take 100 mcg by mouth daily.  . vitamin C  (ASCORBIC ACID) 500 MG tablet Take 500 mg by mouth daily.     Allergies:   Nasonex [mometasone furoate]   Social History   Socioeconomic History  . Marital status: Divorced    Spouse name: Not on file  . Number of children: 0  . Years of education: College  . Highest education level: Not on file  Occupational History  . Occupation: ADJUSTER    Comment: [employer]  Social Needs  . Financial resource strain: Not on file  . Food insecurity    Worry: Not on file    Inability: Not on file  . Transportation needs    Medical: Not on file    Non-medical: Not on file  Tobacco Use  . Smoking status: Never Smoker  . Smokeless tobacco: Never Used  Substance and Sexual Activity  . Alcohol use: No    Alcohol/week: 0.0 standard drinks  . Drug use: No  . Sexual activity: Not on file  Lifestyle  . Physical activity    Days per week: Not on file    Minutes per session: Not on file  . Stress: Not on file  Relationships  . Social Herbalist on phone: Not on file    Gets together: Not on file    Attends religious service: Not on file    Active member of club or organization: Not on file    Attends meetings of clubs or organizations: Not on file    Relationship status: Not on file  Other Topics Concern  . Not on file  Social History Narrative   Consumes 2 sodas a day      Family History: The patient's family history includes Atrial fibrillation in his father; CAD in his father; Diabetes in his sister; Osteoarthritis in his brother and mother.  ROS:   Review of Systems  Constitution: Positive for weight gain.  HENT: Negative.   Eyes: Negative.   Cardiovascular: Positive for chest pain and leg swelling.  Respiratory: Positive for snoring.   Endocrine: Negative.   Hematologic/Lymphatic: Negative.   Skin: Negative.   Musculoskeletal: Positive for back pain and neck pain.  Gastrointestinal: Negative.   Genitourinary: Negative.   Neurological: Negative.    Psychiatric/Behavioral: Negative.   Allergic/Immunologic: Negative.    Please see the history of present illness.     All other systems reviewed and are negative.  EKGs/Labs/Other Studies Reviewed:    The following studies were reviewed today:   EKG:  EKG is   ordered today.  The ekg ordered today is personally reviewed and demonstrates sinus rhythm rightward axis otherwise normal EKG  Recent Labs: Requested from his PCP  Physical Exam:    VS:  BP (!) 188/116 (BP Location: Left Arm, Patient Position: Sitting, Cuff Size: Large)   Pulse 94   Ht 6\' 1"  (1.854 m)  Wt (!) 349 lb 12.8 oz (158.7 kg)   SpO2 97%   BMI 46.15 kg/m     Wt Readings from Last 3 Encounters:  08/21/19 (!) 349 lb 12.8 oz (158.7 kg)  03/01/15 (!) 336 lb (152.4 kg)     GEN: Marked obesity thick neck sleep apnea appearance well nourished, well developed in no acute distress HEENT: Normal NECK: No JVD; No carotid bruits LYMPHATICS: No lymphadenopathy CARDIAC: RRR, no murmurs, rubs, gallops RESPIRATORY:  Clear to auscultation without rales, wheezing or rhonchi  ABDOMEN: Soft, non-tender, non-distended MUSCULOSKELETAL:  No edema; No deformity  SKIN: Warm and dry NEUROLOGIC:  Alert and oriented x 3 PSYCHIATRIC:  Normal affect     Signed, Shirlee More, MD  08/21/2019 10:19 AM    [place]

## 2019-08-21 ENCOUNTER — Encounter: Payer: Self-pay | Admitting: Cardiology

## 2019-08-21 ENCOUNTER — Ambulatory Visit (INDEPENDENT_AMBULATORY_CARE_PROVIDER_SITE_OTHER): Payer: 59 | Admitting: Cardiology

## 2019-08-21 ENCOUNTER — Encounter: Payer: Self-pay | Admitting: *Deleted

## 2019-08-21 ENCOUNTER — Other Ambulatory Visit: Payer: Self-pay

## 2019-08-21 VITALS — BP 188/116 | HR 94 | Ht 73.0 in | Wt 349.8 lb

## 2019-08-21 DIAGNOSIS — G4733 Obstructive sleep apnea (adult) (pediatric): Secondary | ICD-10-CM

## 2019-08-21 DIAGNOSIS — I10 Essential (primary) hypertension: Secondary | ICD-10-CM

## 2019-08-21 DIAGNOSIS — R079 Chest pain, unspecified: Secondary | ICD-10-CM | POA: Diagnosis not present

## 2019-08-21 DIAGNOSIS — M7989 Other specified soft tissue disorders: Secondary | ICD-10-CM

## 2019-08-21 MED ORDER — TELMISARTAN 40 MG PO TABS: 40.0000 mg | ORAL_TABLET | Freq: Every day | ORAL | 3 refills | Status: DC

## 2019-08-21 NOTE — Patient Instructions (Signed)
Medication Instructions:  Your physician has recommended you make the following change in your medication:  START telmisartan (micardis) 40 mg: Take 1 tablet daily   *If you need a refill on your cardiac medications before your next appointment, please call your pharmacy*  Lab Work: None  If you have labs (blood work) drawn today and your tests are completely normal, you will receive your results only by:  [facility] (if you have MyChart) OR  A paper copy in the mail If you have any lab test that is abnormal or we need to change your treatment, we will call you to review the results.  Testing/Procedures: You had an EKG today.   Your physician has requested that you have an echocardiogram. Echocardiography is a painless test that uses sound waves to create images of your heart. It provides your doctor with information about the size and shape of your heart and how well your hearts chambers and valves are working. This procedure takes approximately one hour. There are no restrictions for this procedure.  Your physician has requested that you have a lexiscan myoview. For further information please visit [email or web address]. Please follow instruction sheet, as given.  Follow-Up: At [facility], you and your health needs are our priority.  As part of our continuing mission to provide you with exceptional heart care, we have created designated Provider Care Teams.  These Care Teams include your primary Cardiologist (physician) and Advanced Practice Providers (APPs -  Physician Assistants and Nurse Practitioners) who all work together to provide you with the care you need, when you need it.  Your next appointment:   3 week(s)  The format for your next appointment:   In Person  Provider:   Shirlee More, MD  Other Instructions **Check your blood pressure daily at the same time every day. Record these readings and bring your BP log with you to your next appointment for Dr. Bettina Gavia  to review.     Telmisartan tablets What is this medicine? TELMISARTAN (tel mi SAR tan) is used to treat high blood pressure. This medicine may be used for other purposes; ask your health care provider or pharmacist if you have questions. COMMON BRAND NAME(S): Micardis What should I tell my health care provider before I take this medicine? They need to know if you have any of these conditions:  if you are on a special diet, such as a low-salt diet  kidney or liver disease  an unusual or allergic reaction to telmisartan, other medicines, foods, dyes, or preservatives  pregnant or trying to get pregnant  breast-feeding How should I use this medicine? Take this medicine by mouth with a glass of water. Follow the directions on the prescription label. This medicine can be taken with or without food. Take your doses at regular intervals. Do not take your medicine more often than directed. Talk to your pediatrician regarding the use of this medicine in children. Special care may be needed. Overdosage: If you think you have taken too much of this medicine contact a poison control center or emergency room at once. NOTE: This medicine is only for you. Do not share this medicine with others. What if I miss a dose? If you miss a dose, take it as soon as you can. If it is almost time for your next dose, take only that dose. Do not take double or extra doses. What may interact with this medicine?  digoxin  potassium salts or potassium supplements  warfarin This list may  not describe all possible interactions. Give your health care provider a list of all the medicines, herbs, non-prescription drugs, or dietary supplements you use. Also tell them if you smoke, drink alcohol, or use illegal drugs. Some items may interact with your medicine. What should I watch for while using this medicine? Visit your doctor or health care professional for regular checks on your progress. Check your blood pressure  as directed. Ask your doctor or health care professional what your blood pressure should be and when you should contact him or her. Call your doctor or health care professional if you notice an irregular or fast heart beat. Women should inform their doctor if they wish to become pregnant or think they might be pregnant. There is a potential for serious side effects to an unborn child, particularly in the second or third trimester. Talk to your health care professional or pharmacist for more information. You may get drowsy or dizzy. Do not drive, use machinery, or do anything that needs mental alertness until you know how this drug affects you. Do not stand or sit up quickly, especially if you are an older patient. This reduces the risk of dizzy or fainting spells. Alcohol can make you more drowsy and dizzy. Avoid alcoholic drinks. Avoid salt substitutes unless you are told otherwise by your doctor or health care professional. Do not treat yourself for coughs, colds, or pain while you are taking this medicine without asking your doctor or health care professional for advice. Some ingredients may increase your blood pressure. What side effects may I notice from receiving this medicine? Side effects that you should report to your doctor or health care professional as soon as possible:  allergic reactions like skin rash, itching or hives, swelling of the face, lips, or tongue  breathing problems  dark urine  gout pain  muscle pains  slow heartbeat  trouble passing urine or change in the amount of urine  unusual bleeding or bruising  yellowing of the eyes or skin Side effects that usually do not require medical attention (report to your doctor or health care professional if they continue or are bothersome):  back pain  change in sex drive or performance  diarrhea  sore throat or stuffy nose This list may not describe all possible side effects. Call your doctor for medical advice about  side effects. You may report side effects to FDA at 1-800-FDA-1088. Where should I keep my medicine? Keep out of the reach of children. Store at room temperature between 15 and 30 degrees C (59 and 86 degrees F). Tablets should not be removed from the blisters until right before use. Throw away any unused medicine after the expiration date. NOTE: This sheet is a summary. It may not cover all possible information. If you have questions about this medicine, talk to your doctor, pharmacist, or health care provider.  2020 Elsevier/Gold Standard (2007-11-23 13:39:10)    Echocardiogram An echocardiogram is a procedure that uses painless sound waves (ultrasound) to produce an image of the heart. Images from an echocardiogram can provide important information about:  Signs of coronary artery disease (CAD).  Aneurysm detection. An aneurysm is a weak or damaged part of an artery wall that bulges out from the normal force of blood pumping through the body.  Heart size and shape. Changes in the size or shape of the heart can be associated with certain conditions, including heart failure, aneurysm, and CAD.  Heart muscle function.  Heart valve function.  Signs of  a past heart attack.  Fluid buildup around the heart.  Thickening of the heart muscle.  A tumor or infectious growth around the heart valves. Tell a health care provider about:  Any allergies you have.  All medicines you are taking, including vitamins, herbs, eye drops, creams, and over-the-counter medicines.  Any blood disorders you have.  Any surgeries you have had.  Any medical conditions you have.  Whether you are pregnant or may be pregnant. What are the risks? Generally, this is a safe procedure. However, problems may occur, including:  Allergic reaction to dye (contrast) that may be used during the procedure. What happens before the procedure? No specific preparation is needed. You may eat and drink normally. What  happens during the procedure?   An IV tube may be inserted into one of your veins.  You may receive contrast through this tube. A contrast is an injection that improves the quality of the pictures from your heart.  A gel will be applied to your chest.  A wand-like tool (transducer) will be moved over your chest. The gel will help to transmit the sound waves from the transducer.  The sound waves will harmlessly bounce off of your heart to allow the heart images to be captured in real-time motion. The images will be recorded on a computer. The procedure may vary among health care providers and hospitals. What happens after the procedure?  You may return to your normal, everyday life, including diet, activities, and medicines, unless your health care provider tells you not to do that. Summary  An echocardiogram is a procedure that uses painless sound waves (ultrasound) to produce an image of the heart.  Images from an echocardiogram can provide important information about the size and shape of your heart, heart muscle function, heart valve function, and fluid buildup around your heart.  You do not need to do anything to prepare before this procedure. You may eat and drink normally.  After the echocardiogram is completed, you may return to your normal, everyday life, unless your health care provider tells you not to do that. This information is not intended to replace advice given to you by your health care provider. Make sure you discuss any questions you have with your health care provider. Document Released: 09/04/2000 Document Revised: 12/29/2018 Document Reviewed: 10/10/2016 Elsevier Patient Education  2020 [place].    Cardiac Nuclear Scan A cardiac nuclear scan is a test that is done to check the flow of blood to your heart. It is done when you are resting and when you are exercising. The test looks for problems such as:  Not enough blood reaching a portion of the  heart.  The heart muscle not working as it should. You may need this test if:  You have heart disease.  You have had lab results that are not normal.  You have had heart surgery or a balloon procedure to open up blocked arteries (angioplasty).  You have chest pain.  You have shortness of breath. In this test, a special dye (tracer) is put into your bloodstream. The tracer will travel to your heart. A camera will then take pictures of your heart to see how the tracer moves through your heart. This test is usually done at a hospital and takes 2-4 hours. Tell a doctor about:  Any allergies you have.  All medicines you are taking, including vitamins, herbs, eye drops, creams, and over-the-counter medicines.  Any problems you or family members have had with  anesthetic medicines.  Any blood disorders you have.  Any surgeries you have had.  Any medical conditions you have.  Whether you are pregnant or may be pregnant. What are the risks? Generally, this is a safe test. However, problems may occur, such as:  Serious chest pain and heart attack. This is only a risk if the stress portion of the test is done.  Rapid heartbeat.  A feeling of warmth in your chest. This feeling usually does not last long.  Allergic reaction to the tracer. What happens before the test?  Ask your doctor about changing or stopping your normal medicines. This is important.  Follow instructions from your doctor about what you cannot eat or drink.  Remove your jewelry on the day of the test. What happens during the test?  An IV tube will be inserted into one of your veins.  Your doctor will give you a small amount of tracer through the IV tube.  You will wait for 20-40 minutes while the tracer moves through your bloodstream.  Your heart will be monitored with an electrocardiogram (ECG).  You will lie down on an exam table.  Pictures of your heart will be taken for about 15-20 minutes.  You  may also have a stress test. For this test, one of these things may be done: ? You will be asked to exercise on a treadmill or a stationary bike. ? You will be given medicines that will make your heart work harder. This is done if you are unable to exercise.  When blood flow to your heart has peaked, a tracer will again be given through the IV tube.  After 20-40 minutes, you will get back on the exam table. More pictures will be taken of your heart.  Depending on the tracer that is used, more pictures may need to be taken 3-4 hours later.  Your IV tube will be removed when the test is over. The test may vary among doctors and hospitals. What happens after the test?  Ask your doctor: ? Whether you can return to your normal schedule, including diet, activities, and medicines. ? Whether you should drink more fluids. This will help to remove the tracer from your body. Drink enough fluid to keep your pee (urine) pale yellow.  Ask your doctor, or the department that is doing the test: ? When will my results be ready? ? How will I get my results? Summary  A cardiac nuclear scan is a test that is done to check the flow of blood to your heart.  Tell your doctor whether you are pregnant or may be pregnant.  Before the test, ask your doctor about changing or stopping your normal medicines. This is important.  Ask your doctor whether you can return to your normal activities. You may be asked to drink more fluids. This information is not intended to replace advice given to you by your health care provider. Make sure you discuss any questions you have with your health care provider. Document Released: 02/21/2018 Document Revised: 12/28/2018 Document Reviewed: 02/21/2018 Elsevier Patient Education  2020 [employer].

## 2019-08-21 NOTE — Addendum Note (Signed)
Addended by: Austin Miles on: 08/21/2019 10:28 AM   Modules accepted: Orders

## 2019-09-01 ENCOUNTER — Telehealth (HOSPITAL_COMMUNITY): Payer: Self-pay | Admitting: *Deleted

## 2019-09-01 NOTE — Telephone Encounter (Signed)
Patient given detailed instructions per Myocardial Perfusion Study Information Sheet for the test on 09/04/19 at 8:15. Patient notified to arrive 15 minutes early and that it is imperative to arrive on time for appointment to keep from having the test rescheduled.  If you need to cancel or reschedule your appointment, please call the office within 24 hours of your appointment. . Patient verbalized understanding.Christian Douglas

## 2019-09-04 ENCOUNTER — Ambulatory Visit (HOSPITAL_BASED_OUTPATIENT_CLINIC_OR_DEPARTMENT_OTHER): Payer: 59

## 2019-09-04 ENCOUNTER — Other Ambulatory Visit: Payer: Self-pay

## 2019-09-04 ENCOUNTER — Ambulatory Visit (HOSPITAL_COMMUNITY): Payer: 59 | Attending: Cardiovascular Disease

## 2019-09-04 VITALS — Ht 73.0 in | Wt 345.0 lb

## 2019-09-04 DIAGNOSIS — R079 Chest pain, unspecified: Secondary | ICD-10-CM

## 2019-09-04 DIAGNOSIS — I10 Essential (primary) hypertension: Secondary | ICD-10-CM | POA: Diagnosis present

## 2019-09-04 DIAGNOSIS — M7989 Other specified soft tissue disorders: Secondary | ICD-10-CM | POA: Diagnosis present

## 2019-09-04 MED ORDER — TECHNETIUM TC 99M TETROFOSMIN IV KIT
31.8000 | PACK | Freq: Once | INTRAVENOUS | Status: AC | PRN
  Administered 2019-09-04: 31.8 via INTRAVENOUS
  Filled 2019-09-04: qty 32

## 2019-09-04 MED ORDER — REGADENOSON 0.4 MG/5ML IV SOLN
0.4000 mg | Freq: Once | INTRAVENOUS | Status: AC
  Administered 2019-09-04: 0.4 mg via INTRAVENOUS

## 2019-09-04 MED ORDER — PERFLUTREN LIPID MICROSPHERE
1.0000 mL | INTRAVENOUS | Status: AC | PRN
  Administered 2019-09-04: 2 mL via INTRAVENOUS

## 2019-09-05 ENCOUNTER — Ambulatory Visit (HOSPITAL_COMMUNITY): Payer: 59 | Attending: Cardiovascular Disease

## 2019-09-05 LAB — MYOCARDIAL PERFUSION IMAGING
LV dias vol: 126 mL (ref 62–150)
LV sys vol: 63 mL
Peak HR: 117 {beats}/min
Rest HR: 98 {beats}/min
SDS: 2
SRS: 0
SSS: 2
TID: 0.76

## 2019-09-05 MED ORDER — TECHNETIUM TC 99M TETROFOSMIN IV KIT
32.6000 | PACK | Freq: Once | INTRAVENOUS | Status: AC | PRN
  Administered 2019-09-05: 32.6 via INTRAVENOUS
  Filled 2019-09-05: qty 33

## 2019-09-06 ENCOUNTER — Telehealth: Payer: Self-pay | Admitting: *Deleted

## 2019-09-06 DIAGNOSIS — I7789 Other specified disorders of arteries and arterioles: Secondary | ICD-10-CM | POA: Insufficient documentation

## 2019-09-06 DIAGNOSIS — Z01812 Encounter for preprocedural laboratory examination: Secondary | ICD-10-CM

## 2019-09-06 NOTE — Telephone Encounter (Signed)
Patient informed of echocardiogram results and advised that Dr. Bettina Gavia recommends a CTA chest aorta w/wo contrast to further evaluate aortic enlargement. Patient is agreeable and will have a BMP drawn during his office visit on Friday, 09/08/2019. Advised patient that insurance approval will have to be received before testing could be scheduled. Precert message sent. Will forward to scheduling once approval is received. Patient verbalized understanding. No further questions.

## 2019-09-06 NOTE — Telephone Encounter (Signed)
-----   Message from Richardo Priest, MD sent at 09/04/2019 12:13 PM EST ----- Normal or stable result  His aorta is dilated enlarged he needs to have a CTA of the chest with contrast regarding aortic enlargement performed

## 2019-09-07 NOTE — Progress Notes (Signed)
Cardiology Office Note:    Date:  09/08/2019   ID:  Christian Douglas, DOB 1961/06/22, MRN [id number]  PCP:  Greig Right, MD  Cardiologist:  Shirlee More, MD    Referring MD: Greig Right, MD    ASSESSMENT:    1. Chest pain of uncertain etiology   2. Enlarged aorta (HCC)   3. Essential hypertension   4. Obstructive sleep apnea    PLAN:    In order of problems listed above:  1. He has had no further chest pain normal myocardial perfusion study at this time I would not advise coronary angiography or further ischemia evaluation.  Gregroy is quite reassured. 2. Mild enlargement ascending aorta on echocardiogram, however unfortunately images are very limited of the thoracic aorta CTA will be performed for definition I think the mechanism here his age and hypertension I doubt he will require interventions.  I told him we contact him with report by phone.  Aleve follow-up in my office as needed for now 3. Improved home blood pressure runs 130-140/70-80 and repeat by me in the office 142/86.  Continue his ARB.  Will check renal function today 4. Poorly controlled he is unwilling to wear CPAP I strongly encouraged him to consider bariatric surgery for his obesity   Next appointment: As needed   Medication Adjustments/Labs and Tests Ordered: Current medicines are reviewed at length with the patient today.  Concerns regarding medicines are outlined above.  No orders of the defined types were placed in this encounter.  No orders of the defined types were placed in this encounter.   Chief Complaint  Patient presents with  . Follow-up    after testing    History of Present Illness:    Christian Douglas is a 58 y.o. male with a hx of chest pain last seen 08/21/2019.He has a history of hypertension does not take antihypertensive agents he has marked obesity has gained about 70 to 80 pounds in the last few years after weight loss and has untreated sleep apnea been CPAP intolerant.   Hypertension was uncontrolled and he was initiated on an ARB.  He returns today to check labs including BMP assessment of hypertension control and he is scheduled for CTA of chest to better visualize his thoracic aorta.  I independently reviewed his echocardiogram and I agree that there is mild to moderate diffuse enlargement of his ascending aorta in this age and context usual reflection of hypertension.  Compliance with diet, lifestyle and medications: Yes  He is reassured and feels better has had no chest pain shortness of breath edema palpitation or syncope.  Echo 09/04/2019:  1. Left ventricular ejection fraction, by visual estimation, is 60 to 65%. The left ventricle has normal function. There is moderately increased left ventricular hypertrophy.  2. The left ventricle has no regional wall motion abnormalities.  3. Global right ventricle has normal systolic function.The right ventricular size is normal. No increase in right ventricular wall thickness.  4. Left atrial size was normal.  5. Right atrial size was normal.  6. The mitral valve is normal in structure. No evidence of mitral valve regurgitation.  7. The tricuspid valve is normal in structure. Tricuspid valve regurgitation is trivial.  8. The aortic valve is tricuspid. Aortic valve regurgitation is not visualized. No evidence of aortic valve sclerosis or stenosis.  9. The pulmonic valve was normal in structure. Pulmonic valve regurgitation is not visualized. 10. Aneurysm of the ascending aorta. 11. Aortic dilatation noted 76mm. 12.  There is mild to moderate dilatation of the ascending aorta measuring 39 mm. 13. The atrial septum is grossly normal  MPI 09/05/2019: Nuclear Stress Findings  Isotope administration Rest isotope was administered  with an IV injection of 32.6 mCi Tc54m Tetrofosmin.  Rest SPECT images were obtained approximately 45 minutes post tracer injection.  Stress isotope was administered  with an IV injection of  31.8 mCi Tc30m Tetrofosmin   Stress SPECT images were obtained approximately 60 minutes post tracer injection.  Nuclear Study Quality Overall image quality is excellent.  Nuclear Measurements Study was gated.  Rest Perfusion Rest perfusion normal.  Stress Perfusion Stress perfusion normal.  Overall Study Impression Myocardial perfusion is normal.   The study is normal.   This is a low risk study.  Overall left ventricular systolic function was abnormal.    LV cavity size is normal.  Nuclear stress EF:  50%.  The left ventricular ejection fraction is mildly decreased (45-54%).    From: ACCF/SCAI/STS/AATS/AHA/ASNC/HFSA/SCCT 2012 Appropriate Use Criteria for Coronary Revascularization Focused Update    Past Medical History:  Diagnosis Date  . Benign prostatic hypertrophy   . Bladder cancer ([place]) 2007  . ED (erectile dysfunction)   . GERD (gastroesophageal reflux disease)   . Hypertension   . Melanoma ([place])   . Sleep apnea     Past Surgical History:  Procedure Laterality Date  . KNEE SURGERY    . MELANOMA EXCISION    . PILONIDAL CYST EXCISION    . SHOULDER SURGERY    . TONSILLECTOMY AND ADENOIDECTOMY      Current Medications: Current Meds  Medication Sig  . calcium-vitamin D (OSCAL WITH D) 250-125 MG-UNIT per tablet Take 1 tablet by mouth daily.  . Garlic 10 MG CAPS Take by mouth.  . magnesium 30 MG tablet Take 30 mg by mouth 2 (two) times daily.  . Multiple Vitamin (MULTIVITAMIN) capsule Take 1 capsule by mouth daily.  . pantoprazole (PROTONIX) 40 MG tablet Take 40 mg by mouth daily.  Marland Kitchen telmisartan (MICARDIS) 40 MG tablet Take 1 tablet (40 mg total) by mouth daily.  . vitamin B-12 (CYANOCOBALAMIN) 100 MCG tablet Take 100 mcg by mouth daily.  . vitamin C (ASCORBIC ACID) 500 MG tablet Take 500 mg by mouth daily.     Allergies:   Nasonex [mometasone furoate]   Social History   Socioeconomic History  . Marital status: Divorced    Spouse name: Not on file  . Number of  children: 0  . Years of education: College  . Highest education level: Not on file  Occupational History  . Occupation: ADJUSTER    Comment: [employer]  Tobacco Use  . Smoking status: Never Smoker  . Smokeless tobacco: Never Used  Substance and Sexual Activity  . Alcohol use: No    Alcohol/week: 0.0 standard drinks  . Drug use: No  . Sexual activity: Not on file  Other Topics Concern  . Not on file  Social History Narrative   Consumes 2 sodas a day    Social Determinants of Health   Financial Resource Strain:   . Difficulty of Paying Living Expenses: Not on file  Food Insecurity:   . Worried About Charity fundraiser in the Last Year: Not on file  . Ran Out of Food in the Last Year: Not on file  Transportation Needs:   . Lack of Transportation (Medical): Not on file  . Lack of Transportation (Non-Medical): Not on file  Physical Activity:   .  Days of Exercise per Week: Not on file  . Minutes of Exercise per Session: Not on file  Stress:   . Feeling of Stress : Not on file  Social Connections:   . Frequency of Communication with Friends and Family: Not on file  . Frequency of Social Gatherings with Friends and Family: Not on file  . Attends Religious Services: Not on file  . Active Member of Clubs or Organizations: Not on file  . Attends Archivist Meetings: Not on file  . Marital Status: Not on file     Family History: The patient's family history includes Atrial fibrillation in his father; CAD in his father; Diabetes in his sister; Osteoarthritis in his brother and mother. ROS:   Please see the history of present illness.    All other systems reviewed and are negative.  EKGs/Labs/Other Studies Reviewed:    The following studies were reviewed today:   Recent Labs: No results found for requested labs within last 8760 hours.  Recent Lipid Panel No results found for: CHOL, TRIG, HDL, CHOLHDL, VLDL, LDLCALC, LDLDIRECT  Physical Exam:    VS:  BP (!)  162/104 (BP Location: Right Arm, Patient Position: Sitting, Cuff Size: Large)   Pulse 89   Ht 6\' 1"  (1.854 m)   Wt (!) 354 lb (160.6 kg)   SpO2 93%   BMI 46.70 kg/m     Wt Readings from Last 3 Encounters:  09/08/19 (!) 354 lb (160.6 kg)  09/04/19 (!) 345 lb (156.5 kg)  08/21/19 (!) 349 lb 12.8 oz (158.7 kg)    blood pressure by me right upper extremity large cuff sitting resting 5 minutes 1 4286 GEN: Marked obesity well nourished, well developed in no acute distress HEENT: Normal NECK: No JVD; No carotid bruits LYMPHATICS: No lymphadenopathy CARDIAC: RRR, no murmurs, rubs, gallops RESPIRATORY:  Clear to auscultation without rales, wheezing or rhonchi  ABDOMEN: Soft, non-tender, non-distended MUSCULOSKELETAL:  No edema; No deformity  SKIN: Warm and dry NEUROLOGIC:  Alert and oriented x 3 PSYCHIATRIC:  Normal affect    Signed, Shirlee More, MD  09/08/2019 9:47 AM    [place]

## 2019-09-08 ENCOUNTER — Ambulatory Visit (INDEPENDENT_AMBULATORY_CARE_PROVIDER_SITE_OTHER): Payer: 59 | Admitting: Cardiology

## 2019-09-08 ENCOUNTER — Encounter: Payer: Self-pay | Admitting: Cardiology

## 2019-09-08 ENCOUNTER — Other Ambulatory Visit: Payer: Self-pay

## 2019-09-08 VITALS — BP 162/104 | HR 89 | Ht 73.0 in | Wt 354.0 lb

## 2019-09-08 DIAGNOSIS — I7789 Other specified disorders of arteries and arterioles: Secondary | ICD-10-CM | POA: Diagnosis not present

## 2019-09-08 DIAGNOSIS — R079 Chest pain, unspecified: Secondary | ICD-10-CM

## 2019-09-08 DIAGNOSIS — G4733 Obstructive sleep apnea (adult) (pediatric): Secondary | ICD-10-CM | POA: Diagnosis not present

## 2019-09-08 DIAGNOSIS — I10 Essential (primary) hypertension: Secondary | ICD-10-CM

## 2019-09-08 NOTE — Patient Instructions (Signed)
Medication Instructions:  Your physician recommends that you continue on your current medications as directed. Please refer to the Current Medication list given to you today.  *If you need a refill on your cardiac medications before your next appointment, please call your pharmacy*  Lab Work: Your physician recommends that you return for lab work today: BMP.   If you have labs (blood work) drawn today and your tests are completely normal, you will receive your results only by: Marland Kitchen MyChart Message (if you have MyChart) OR . A paper copy in the mail If you have any lab test that is abnormal or we need to change your treatment, we will call you to review the results.  Testing/Procedures: Non-Cardiac CT Angiography (CTA), is a special type of CT scan that uses a computer to produce multi-dimensional views of major blood vessels throughout the body. In CT angiography, a contrast material is injected through an IV to help visualize the blood vessels.   Follow-Up: At [facility], you and your health needs are our priority.  As part of our continuing mission to provide you with exceptional heart care, we have created designated Provider Care Teams.  These Care Teams include your primary Cardiologist (physician) and Advanced Practice Providers (APPs -  Physician Assistants and Nurse Practitioners) who all work together to provide you with the care you need, when you need it.  Your next appointment:   As needed if symptoms worsen or fail to improve  The format for your next appointment:   In Person  Provider:   Shirlee More, MD  Other Instructions **Please reach out to your PCP to schedule a blood pressure follow up appointment in 6 weeks.

## 2019-09-09 LAB — BASIC METABOLIC PANEL
BUN/Creatinine Ratio: 15 (ref 9–20)
BUN: 13 mg/dL (ref 6–24)
CO2: 27 mmol/L (ref 20–29)
Calcium: 9.2 mg/dL (ref 8.7–10.2)
Chloride: 100 mmol/L (ref 96–106)
Creatinine, Ser: 0.89 mg/dL (ref 0.76–1.27)
GFR calc Af Amer: 108 mL/min/{1.73_m2} (ref >59)
GFR calc non Af Amer: 94 mL/min/{1.73_m2} (ref >59)
Glucose: 164 mg/dL — ABNORMAL HIGH (ref 65–99)
Potassium: 4.7 mmol/L (ref 3.5–5.2)
Sodium: 139 mmol/L (ref 134–144)

## 2019-09-13 ENCOUNTER — Encounter (HOSPITAL_BASED_OUTPATIENT_CLINIC_OR_DEPARTMENT_OTHER): Payer: Self-pay

## 2019-09-13 ENCOUNTER — Ambulatory Visit (HOSPITAL_BASED_OUTPATIENT_CLINIC_OR_DEPARTMENT_OTHER)
Admission: RE | Admit: 2019-09-13 | Discharge: 2019-09-13 | Disposition: A | Discharge status: Home / Self Care | Payer: 59 | Source: Ambulatory Visit | Attending: Cardiology | Admitting: Cardiology

## 2019-09-13 ENCOUNTER — Other Ambulatory Visit: Payer: Self-pay

## 2019-09-13 DIAGNOSIS — I7789 Other specified disorders of arteries and arterioles: Secondary | ICD-10-CM | POA: Diagnosis not present

## 2019-09-13 MED ORDER — IOHEXOL 350 MG/ML SOLN
100.0000 mL | Freq: Once | INTRAVENOUS | Status: AC | PRN
  Administered 2019-09-13: 10:00:00 100 mL via INTRAVENOUS

## 2019-12-19 ENCOUNTER — Other Ambulatory Visit: Payer: Self-pay | Admitting: Cardiology

## 2019-12-27 ENCOUNTER — Other Ambulatory Visit: Payer: Self-pay | Admitting: Adult Health

## 2019-12-27 NOTE — Progress Notes (Signed)
  I connected by phone with Christian Douglas on 12/27/2019 at 5:09 PM to discuss the potential use of an new treatment for mild to moderate COVID-19 viral infection in non-hospitalized patients.  This patient is a 59 y.o. male that meets the FDA criteria for Emergency Use Authorization of bamlanivimab/etesevimab or casirivimab/imdevimab.  Has a (+) direct SARS-CoV-2 viral test result  Has mild or moderate COVID-19   Is ? 60 years of age and weighs ? 40 kg  Is NOT hospitalized due to COVID-19  Is NOT requiring oxygen therapy or requiring an increase in baseline oxygen flow rate due to COVID-19  Is within 10 days of symptom onset  Has at least one of the high risk factor(s) for progression to severe COVID-19 and/or hospitalization as defined in EUA.  Specific high risk criteria : Hypertension   I have spoken and communicated the following to the patient or parent/caregiver:  1. FDA has authorized the emergency use of bamlanivimab/etesevimab and casirivimab\imdevimab for the treatment of mild to moderate COVID-19 in adults and pediatric patients with positive results of direct SARS-CoV-2 viral testing who are 59 years of age and older weighing at least 40 kg, and who are at high risk for progressing to severe COVID-19 and/or hospitalization.  2. The significant known and potential risks and benefits of bamlanivimab/etesevimab and casirivimab\imdevimab, and the extent to which such potential risks and benefits are unknown.  3. Information on available alternative treatments and the risks and benefits of those alternatives, including clinical trials.  4. Patients treated with bamlanivimab/etesevimab and casirivimab\imdevimab should continue to self-isolate and use infection control measures (e.g., wear mask, isolate, social distance, avoid sharing personal items, clean and disinfect "high touch" surfaces, and frequent handwashing) according to CDC guidelines.   5. The patient or parent/caregiver  has the option to accept or refuse bamlanivimab/etesevimab or casirivimab\imdevimab .  After reviewing this information with the patient, The patient agreed to proceed with receiving the bamlanimivab infusion and will be provided a copy of the Fact sheet prior to receiving the infusion..  Scheduled for 12/28/19 at 1230 , Will bring copy of positive coivd 19 test results.   Tammy Parrett 12/27/2019 5:09 PM

## 2019-12-28 ENCOUNTER — Ambulatory Visit (HOSPITAL_COMMUNITY)
Admission: RE | Admit: 2019-12-28 | Discharge: 2019-12-28 | Disposition: A | Discharge status: Home / Self Care | Payer: 59 | Source: Ambulatory Visit | Attending: Pulmonary Disease | Admitting: Pulmonary Disease

## 2019-12-28 ENCOUNTER — Other Ambulatory Visit (HOSPITAL_COMMUNITY): Payer: Self-pay

## 2019-12-28 DIAGNOSIS — U071 COVID-19: Secondary | ICD-10-CM | POA: Insufficient documentation

## 2019-12-28 DIAGNOSIS — I10 Essential (primary) hypertension: Secondary | ICD-10-CM | POA: Diagnosis not present

## 2019-12-28 MED ORDER — DIPHENHYDRAMINE HCL 50 MG/ML IJ SOLN: 50.0000 mg | Freq: Once | INTRAMUSCULAR | Status: DC | PRN

## 2019-12-28 MED ORDER — SODIUM CHLORIDE 0.9 % IV SOLN: INTRAVENOUS | Status: DC | PRN

## 2019-12-28 MED ORDER — SODIUM CHLORIDE 0.9 % IV SOLN
Freq: Once | INTRAVENOUS | Status: AC
  Filled 2019-12-28: qty 700

## 2019-12-28 MED ORDER — METHYLPREDNISOLONE SODIUM SUCC 125 MG IJ SOLR: 125.0000 mg | Freq: Once | INTRAMUSCULAR | Status: DC | PRN

## 2019-12-28 MED ORDER — FAMOTIDINE IN NACL 20-0.9 MG/50ML-% IV SOLN: 20.0000 mg | Freq: Once | INTRAVENOUS | Status: DC | PRN

## 2019-12-28 MED ORDER — EPINEPHRINE 0.3 MG/0.3ML IJ SOAJ: 0.3000 mg | Freq: Once | INTRAMUSCULAR | Status: DC | PRN

## 2019-12-28 MED ORDER — ALBUTEROL SULFATE HFA 108 (90 BASE) MCG/ACT IN AERS: 2.0000 | INHALATION_SPRAY | Freq: Once | RESPIRATORY_TRACT | Status: DC | PRN

## 2019-12-28 NOTE — Discharge Instructions (Signed)

## 2019-12-28 NOTE — Progress Notes (Signed)
  Diagnosis: COVID-19  Physician: Dr. Joya Gaskins  Procedure: [facility] Med: bamlanivimab\etesevimab infusion - Provided patient with bamlanimivab\etesevimab fact sheet for patients, parents and caregivers prior to infusion.  Complications: No immediate complications noted.  Discharge: Discharged home   Janine Ores 12/28/2019

## 2020-09-06 ENCOUNTER — Other Ambulatory Visit: Payer: Self-pay | Admitting: Cardiology

## 2020-10-04 ENCOUNTER — Other Ambulatory Visit: Payer: Self-pay | Admitting: Cardiology

## 2020-10-17 ENCOUNTER — Other Ambulatory Visit: Payer: Self-pay | Admitting: Cardiology

## 2020-10-17 NOTE — Telephone Encounter (Signed)
Refill sent to pharmacy.   

## 2021-02-04 IMAGING — CT CT ANGIO CHEST
2 of 6 series · 18 of 46 positions shown · IV contrast (Omnipaque)
Comparison: 09/19/2008

CLINICAL DATA: Thoracic aortic aneurysm, preop.

EXAM:
CT ANGIOGRAPHY CHEST WITH CONTRAST
TECHNIQUE: Multidetector CT imaging of the chest was performed using the
standard protocol during bolus administration of intravenous
contrast. Multiplanar CT image reconstructions and MIPs were
obtained to evaluate the vascular anatomy.
CONTRAST:  100mL OMNIPAQUE IOHEXOL 350 MG/ML SOLN

[Series 4: axial arterial · axial · arterial · 0.83mm/px · z∈[-365,-83]mm · 15 of 106 slices shown]
[im 6/106  lung]
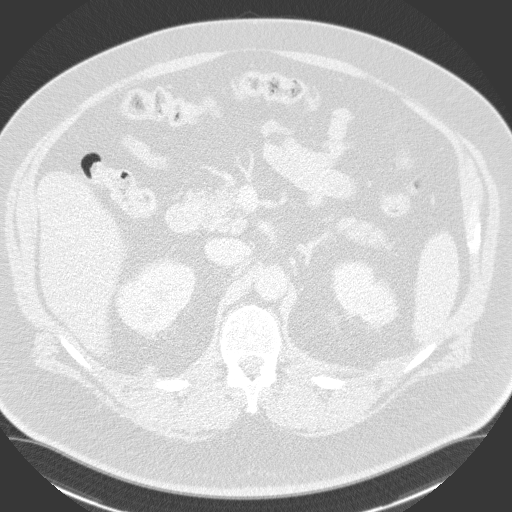
[im 12/106  soft-tissue]
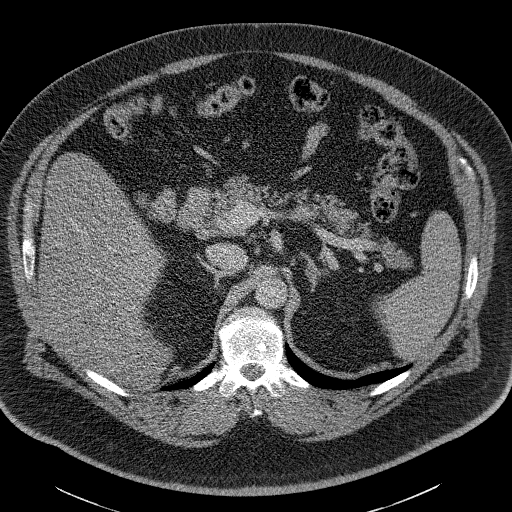
[im 23/106  lung]
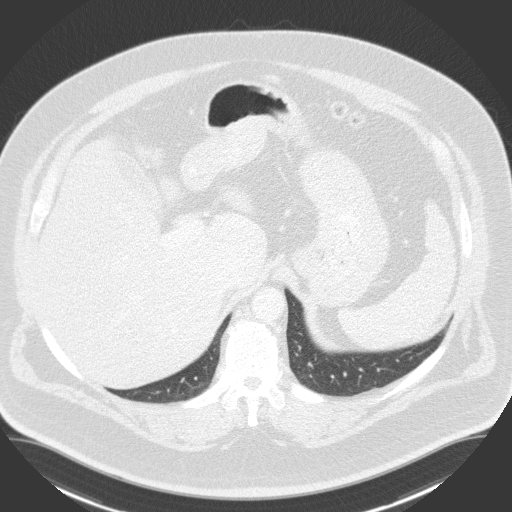
[im 28/106  soft-tissue]
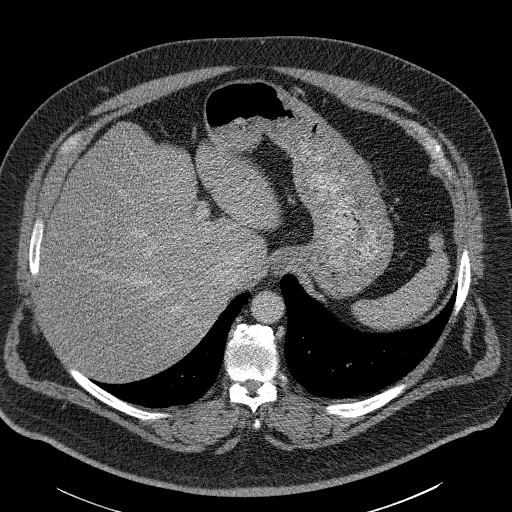
[im 34/106  lung]
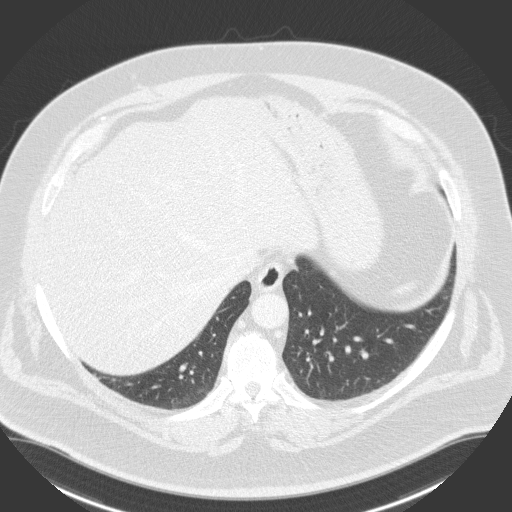
[im 39/106  soft-tissue]
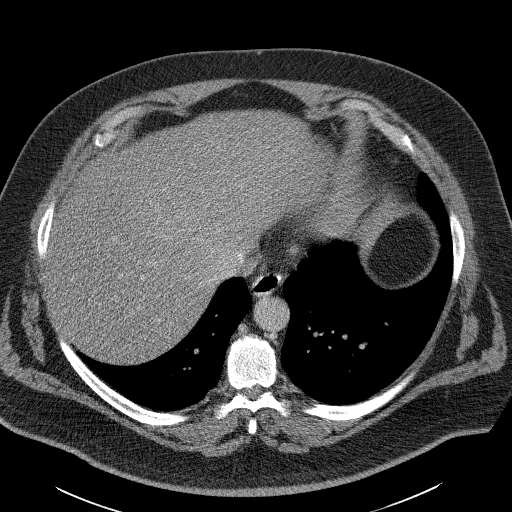
[im 45/106  lung]
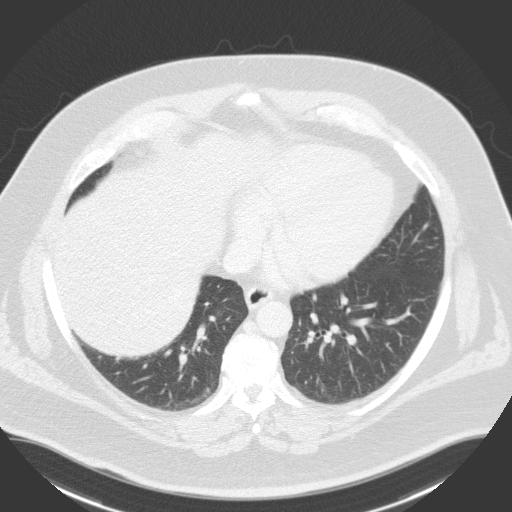
[im 56/106  soft-tissue]
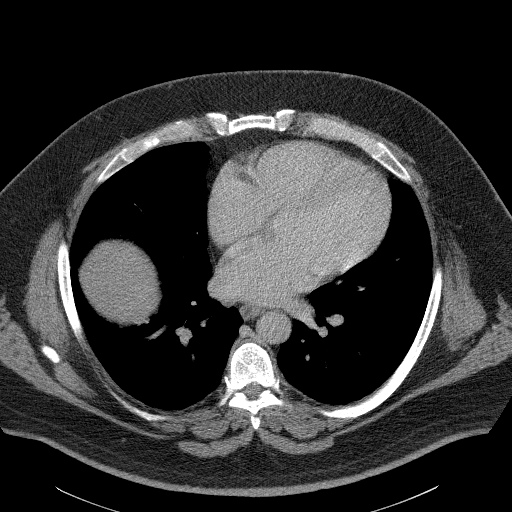
[im 61/106  lung]
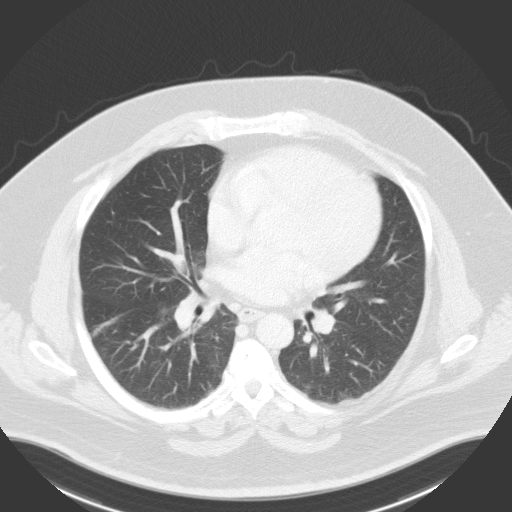
[im 67/106  soft-tissue]
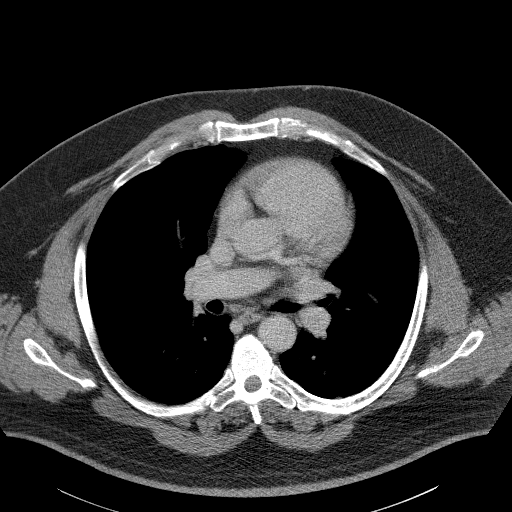
[im 72/106  lung]
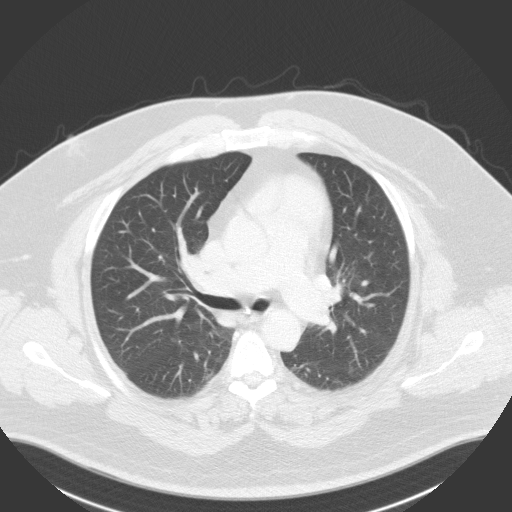
[im 78/106  soft-tissue]
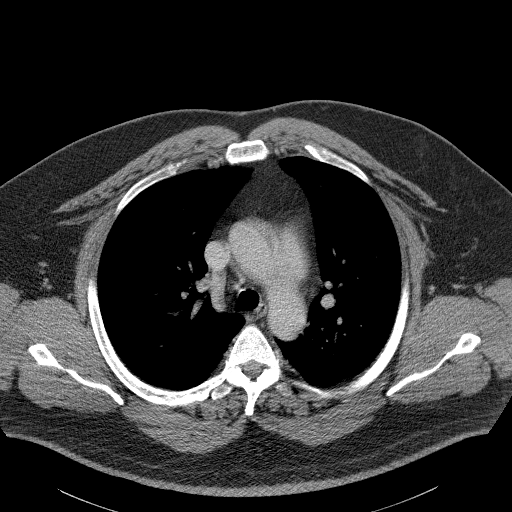
[im 89/106  lung]
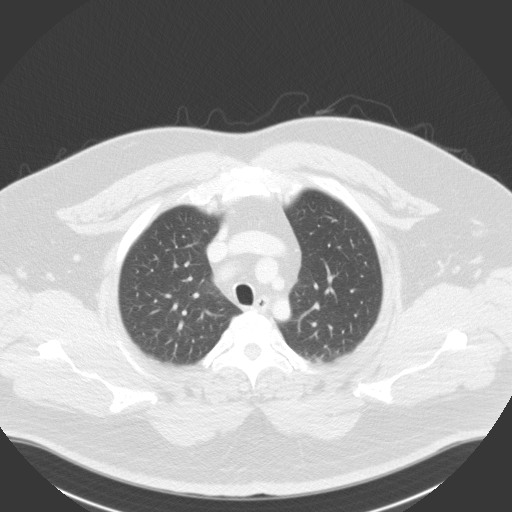
[im 94/106  soft-tissue]
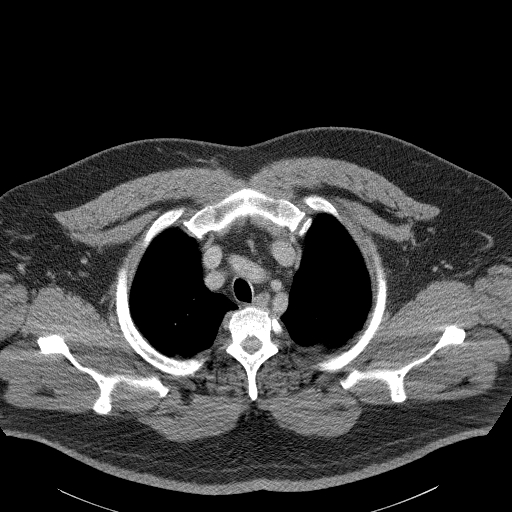
[im 100/106  lung]
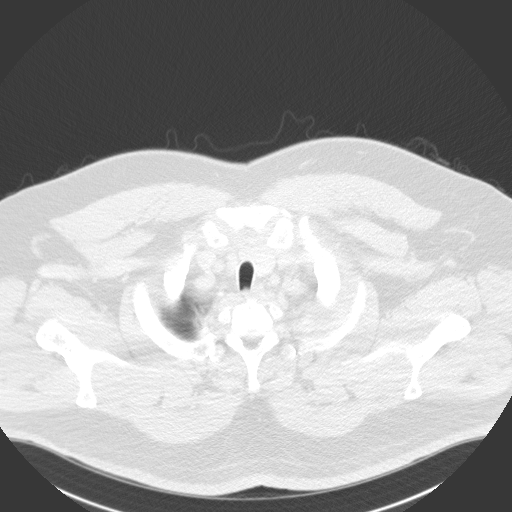

[Series 6: coronal · coronal · 0.66mm/px · 3 of 103 slices shown]
[im 26/103  soft-tissue]
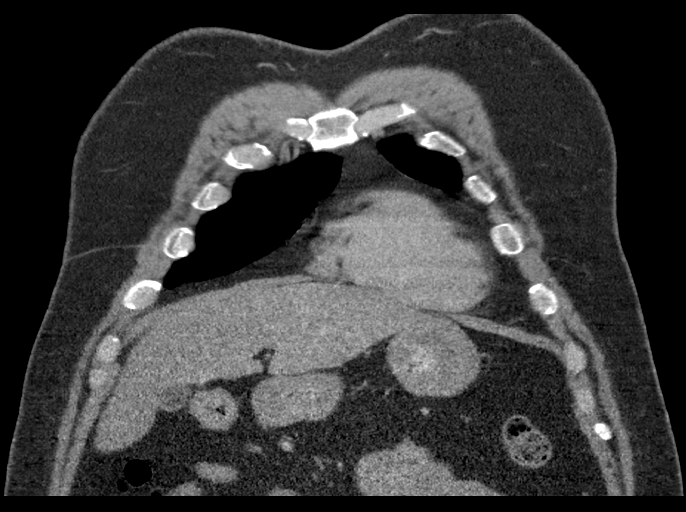
[im 52/103  soft-tissue]
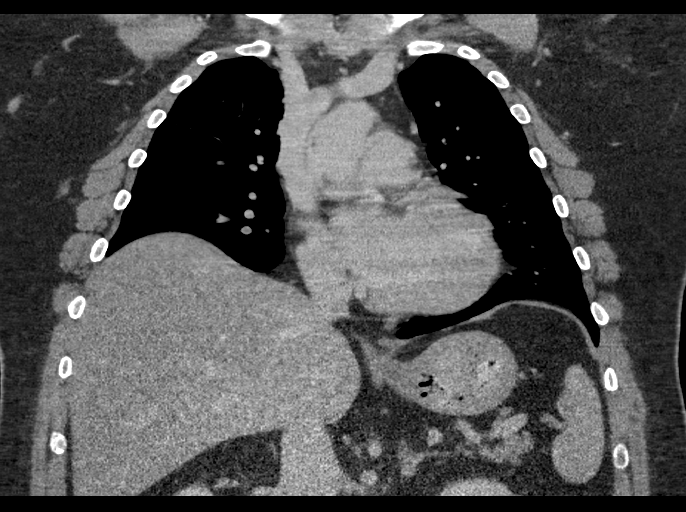
[im 77/103  soft-tissue]
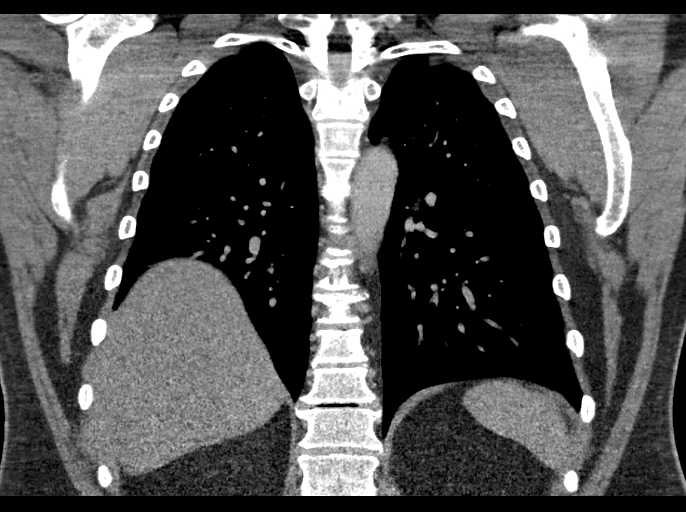

[18 of 46 positions shown; findings below may reference images not displayed]

FINDINGS: Cardiovascular: Heart is normal size. Aorta is normal caliber.
Maximum aortic diameter in the ascending thoracic aorta 3.6 cm. No
evidence of dissection.

Mediastinum/Nodes: No mediastinal, hilar, or axillary adenopathy.
Trachea and esophagus are unremarkable. Thyroid unremarkable.

Lungs/Pleura: Platelike scarring in the right upper lobe, stable
since prior study. No confluent opacities or effusions.

Upper Abdomen: Diffuse fatty infiltration of the liver. This is
stable since prior study. No acute findings.

Musculoskeletal: Chest wall soft tissues are unremarkable. No acute
bony abnormality.

Review of the MIP images confirms the above findings.
IMPRESSION: No evidence of thoracic aortic aneurysm.  Maximum diameter 3.6 cm.

Fatty infiltration of the liver.

No acute cardiopulmonary disease.

## 2021-11-10 ENCOUNTER — Other Ambulatory Visit: Payer: Self-pay | Admitting: Cardiology

## 2022-05-19 ENCOUNTER — Other Ambulatory Visit: Payer: Self-pay | Admitting: Cardiology

## 2024-04-12 NOTE — H&P (Signed)
 TOTAL HIP ADMISSION H&P  Patient is admitted for right total hip arthroplasty.  Subjective:  Chief Complaint: Right hip pain  HPI: Christian Douglas, 63 y.o. male, has a history of pain and functional disability in the right hip due to arthritis and patient has failed non-surgical conservative treatments for greater than 12 weeks to include NSAID's and/or analgesics, use of assistive devices, weight reduction as appropriate, and activity modification. Onset of symptoms was gradual, starting several years ago with gradually worsening course since that time. The patient noted no past surgery on the right hip. Patient currently rates pain in the right hip at 8 out of 10 with activity. Patient has worsening of pain with activity and weight bearing and pain that interfers with activities of daily living. Patient has evidence of subchondral cysts, periarticular osteophytes, and joint space narrowing by imaging studies. This condition presents safety issues increasing the risk of falls.  There is no current active infection.  Patient Active Problem List   Diagnosis Date Noted   Enlarged aorta (HCC) 09/06/2019    Past Medical History:  Diagnosis Date   Benign prostatic hypertrophy    Bladder cancer (HCC) 2007   ED (erectile dysfunction)    GERD (gastroesophageal reflux disease)    Hypertension    Melanoma (HCC)    Sleep apnea     Past Surgical History:  Procedure Laterality Date   KNEE SURGERY     MELANOMA EXCISION     PILONIDAL CYST EXCISION     SHOULDER SURGERY     TONSILLECTOMY AND ADENOIDECTOMY      Prior to Admission medications   Medication Sig Start Date End Date Taking? Authorizing Provider  calcium-vitamin D (OSCAL WITH D) 250-125 MG-UNIT per tablet Take 1 tablet by mouth daily.    [provider]  Garlic 10 MG CAPS Take by mouth.    [provider]  magnesium 30 MG tablet Take 30 mg by mouth 2 (two) times daily.    [provider]  Multiple Vitamin  (MULTIVITAMIN) capsule Take 1 capsule by mouth daily.    [provider]  pantoprazole (PROTONIX) 40 MG tablet Take 40 mg by mouth daily.    [provider]  tadalafil (CIALIS) 20 MG tablet Take 20 mg by mouth daily as needed for erectile dysfunction.    [provider]  telmisartan  (MICARDIS ) 40 MG tablet TAKE 1 TABLET BY MOUTH DAILY 05/19/22   Monetta Redell PARAS, MD  vitamin B-12 (CYANOCOBALAMIN) 100 MCG tablet Take 100 mcg by mouth daily.    [provider]  vitamin C (ASCORBIC ACID) 500 MG tablet Take 500 mg by mouth daily.    [provider]    Allergies  Allergen Reactions   Nasonex [Mometasone Furoate]     Social History   Socioeconomic History   Marital status: Divorced    Spouse name: Not on file   Number of children: 0   Years of education: College   Highest education level: Not on file  Occupational History   Occupation: ADJUSTER    Comment: [employer]  Tobacco Use   Smoking status: Never   Smokeless tobacco: Never  Vaping Use   Vaping status: Never Used  Substance and Sexual Activity   Alcohol use: No    Alcohol/week: 0.0 standard drinks of alcohol   Drug use: No   Sexual activity: Not on file  Other Topics Concern   Not on file  Social History Narrative   Consumes 2 sodas  a day    Social Drivers of Corporate investment banker Strain: Not on file  Food Insecurity: Not on file  Transportation Needs: Not on file  Physical Activity: Not on file  Stress: Not on file  Social Connections: Not on file  Intimate Partner Violence: Not on file    Tobacco Use: Low Risk  (02/22/2020)   Received from [facility] visits prior to 11/21/2022.   Patient History    Smoking Tobacco Use: Never    Smokeless Tobacco Use: Never    Passive Exposure: Not on file   Social History   Substance and Sexual Activity  Alcohol Use No   Alcohol/week: 0.0 standard drinks of alcohol    Family History  Problem Relation  Age of Onset   Osteoarthritis Mother    Atrial fibrillation Father    CAD Father    Diabetes Sister    Osteoarthritis Brother     ROS   Objective:  Physical Exam: - Well-developed male, alert, oriented, no apparent distress.    - Evaluation of his left hip shows normal range of motion without discomfort.    - Right hip can be flexed to 100 degrees.    - External rotation contracture of 20 degrees.    - Will not internally or externally rotate beyond that.    - Significant antalgic gait pattern on the right.    - No trochanteric tenderness on the right.    IMAGING:  Radiographs taken today, AP pelvis and lateral of the right hip, demonstrate bone-on-bone arthritis with subchondral cystic formation. This represents a major progression in arthritic change compared to radiographs from May 2024, where he had normal joint space. He now has no joint space, is completely bone-on-bone, and has some flattening of the femoral head.  Assessment/Plan:  End stage arthritis, right hip  The patient history, physical examination, clinical judgement of the provider and imaging studies are consistent with end stage degenerative joint disease of the right hip and total hip arthroplasty is deemed medically necessary. The treatment options including medical management, injection therapy, arthroscopy and arthroplasty were discussed at length. The risks and benefits of total hip arthroplasty were presented and reviewed. The risks due to aseptic loosening, infection, stiffness, dislocation/subluxation, thromboembolic complications and other imponderables were discussed. The patient acknowledged the explanation, agreed to proceed with the plan and consent was signed. Patient is being admitted for inpatient treatment for surgery, pain control, PT, OT, prophylactic antibiotics, VTE prophylaxis, progressive ambulation and ADLs and discharge planning.The patient is planning to be discharged home.   Patient's  anticipated LOS is less than 2 midnights, meeting these requirements: - Younger than 51 - Lives within 1 hour of care - Has a competent adult at home to recover with post-op recover - NO history of  - Chronic pain requiring opiods  - Diabetes  - Coronary Artery Disease  - Heart failure  - Heart attack  - Stroke  - DVT/VTE  - Cardiac arrhythmia  - Respiratory Failure/COPD  - Renal failure  - Anemia  - Advanced Liver disease  Therapy Plans: HEP Disposition: Home with brother Planned DVT Prophylaxis: Xarelto 10mg  QD DME Needed: None PCP: Sharlet Speaks, MD (clearance pending**) TXA: IV Allergies: mometasone, nasonex Anesthesia Concerns: OSA, does not use CPAP  BMI: 35.4 Last HgbA1c: 5.8% on 04/06/24 Pharmacy: [facility] deliver to room  Other: -Hx bladder cancer in 2007  - Patient was instructed on what medications to stop prior to surgery. -  Follow-up visit in 2 weeks with Dr. Melodi - Begin physical therapy following surgery - Pre-operative lab work as pre-surgical testing - Prescriptions will be provided in hospital at time of discharge  Corean Sender, PA-C Orthopedic Surgery [facility] [place]

## 2024-04-26 NOTE — Patient Instructions (Signed)
 SURGICAL WAITING ROOM VISITATION  Patients having surgery or a procedure may have no more than 2 support people in the waiting area - these visitors may rotate.    Children under the age of 57 must have an adult with them who is not the patient.  Visitors with respiratory illnesses are discouraged from visiting and should remain at home.  If the patient needs to stay at the hospital during part of their recovery, the visitor guidelines for inpatient rooms apply. Pre-op nurse will coordinate an appropriate time for 1 support person to accompany patient in pre-op.  This support person may not rotate.    Please refer to the [facility] website for the visitor guidelines for Inpatients (after your surgery is over and you are in a regular room).       Your procedure is scheduled on: 05-10-24   Report to [facility] Main Entrance    Report to admitting at       10:15    AM   Call this number if you have problems the morning of surgery [phone]   Do not eat food :After Midnight.   After Midnight you may have the following liquids until __0945____ AM/  DAY OF SURGERY    then nothing by mouth  Water Non-Citrus Juices (without pulp, NO RED-Apple, White grape, White cranberry) Black Coffee (NO MILK/CREAM OR CREAMERS, sugar ok)  Clear Tea (NO MILK/CREAM OR CREAMERS, sugar ok) regular and decaf                             Plain Jell-O (NO RED)                                           Fruit ices (not with fruit pulp, NO RED)                                     Popsicles (NO RED)                                                               Sports drinks like Gatorade (NO RED)              .        The day of surgery:  Drink ONE (1) Pre-Surgery Clear Ensure BY 0945 AM the morning of surgery. Drink in one sitting. Do not sip.  This drink was given to you during your hospital  pre-op appointment visit. Nothing else to drink after completing the  Pre-Surgery Clear Ensure .           If you have questions, please contact your surgeon's office.   FOLLOW  ANY ADDITIONAL PRE OP INSTRUCTIONS YOU RECEIVED FROM YOUR SURGEON'S OFFICE!!!     Oral Hygiene is also important to reduce your risk of infection.                                    Remember - BRUSH YOUR  TEETH THE MORNING OF SURGERY WITH YOUR REGULAR TOOTHPASTE  DENTURES WILL BE REMOVED PRIOR TO SURGERY PLEASE DO NOT APPLY Poly grip OR ADHESIVES!!!   Do NOT smoke after Midnight   Stop all vitamins and herbal supplements 7 days before surgery.   Take these medicines the morning of surgery with A SIP OF WATER: famotidine   DO NOT TAKE ANY ORAL DIABETIC MEDICATIONS DAY OF YOUR SURGERY  Bring CPAP mask and tubing day of surgery.                              You may not have any metal on your body including hair pins, jewelry, and body piercing             Do not wear  lotions, powders, perfumes/cologne, or deodorant              Men may shave face and neck.   Do not bring valuables to the hospital. [facility] IS NOT             RESPONSIBLE   FOR VALUABLES.   Contacts, glasses, dentures or bridgework may not be worn into surgery.   Bring small overnight bag day of surgery.   DO NOT BRING YOUR HOME MEDICATIONS TO THE HOSPITAL. PHARMACY WILL DISPENSE MEDICATIONS LISTED ON YOUR MEDICATION LIST TO YOU DURING YOUR ADMISSION IN THE HOSPITAL!    Patients discharged on the day of surgery will not be allowed to drive home.  Someone NEEDS to stay with you for the first 24 hours after anesthesia.   Special Instructions: Bring a copy of your healthcare power of attorney and living will documents the day of surgery if you haven't scanned them before.              Please read over the following fact sheets you were given: IF YOU HAVE QUESTIONS ABOUT YOUR PRE-OP INSTRUCTIONS PLEASE CALL [phone].   . If you test positive for Covid or have been in contact with anyone that has tested positive in the last 10 days  please notify you surgeon.      Pre-operative 5 CHG Bath Instructions   You can play a key role in reducing the risk of infection after surgery. Your skin needs to be as free of germs as possible. You can reduce the number of germs on your skin by washing with CHG (chlorhexidine gluconate) soap before surgery. CHG is an antiseptic soap that kills germs and continues to kill germs even after washing.   DO NOT use if you have an allergy to chlorhexidine/CHG or antibacterial soaps. If your skin becomes reddened or irritated, stop using the CHG and notify one of our RNs at [phone].   Please shower with the CHG soap starting 4 days before surgery using the following schedule:     Please keep in mind the following:  DO NOT shave, including legs and underarms, starting the day of your first shower.   You may shave your face at any point before/day of surgery.  Place clean sheets on your bed the day you start using CHG soap. Use a clean washcloth (not used since being washed) for each shower. DO NOT sleep with pets once you start using the CHG.   CHG Shower Instructions:  If you choose to wash your hair and private area, wash first with your normal shampoo/soap.  After you use shampoo/soap, rinse your hair and body thoroughly to remove  shampoo/soap residue.  Turn the water OFF and apply about 3 tablespoons (45 ml) of CHG soap to a CLEAN washcloth.  Apply CHG soap ONLY FROM YOUR NECK DOWN TO YOUR TOES (washing for 3-5 minutes)  DO NOT use CHG soap on face, private areas, open wounds, or sores.  Pay special attention to the area where your surgery is being performed.  If you are having back surgery, having someone wash your back for you may be helpful. Wait 2 minutes after CHG soap is applied, then you may rinse off the CHG soap.  Pat dry with a clean towel  Put on clean clothes/pajamas   If you choose to wear lotion, please use ONLY the CHG-compatible lotions on the back of this paper.      Additional instructions for the day of surgery: DO NOT APPLY any lotions, deodorants, cologne, or perfumes.   Put on clean/comfortable clothes.  Brush your teeth.  Ask your nurse before applying any prescription medications to the skin.      CHG Compatible Lotions   Aveeno Moisturizing lotion  Cetaphil Moisturizing Cream  Cetaphil Moisturizing Lotion  Clairol Herbal Essence Moisturizing Lotion, Dry Skin  Clairol Herbal Essence Moisturizing Lotion, Extra Dry Skin  Clairol Herbal Essence Moisturizing Lotion, Normal Skin  Curel Age Defying Therapeutic Moisturizing Lotion with Alpha Hydroxy  Curel Extreme Care Body Lotion  Curel Soothing Hands Moisturizing Hand Lotion  Curel Therapeutic Moisturizing Cream, Fragrance-Free  Curel Therapeutic Moisturizing Lotion, Fragrance-Free  Curel Therapeutic Moisturizing Lotion, Original Formula  Eucerin Daily Replenishing Lotion  Eucerin Dry Skin Therapy Plus Alpha Hydroxy Crme  Eucerin Dry Skin Therapy Plus Alpha Hydroxy Lotion  Eucerin Original Crme  Eucerin Original Lotion  Eucerin Plus Crme Eucerin Plus Lotion  Eucerin TriLipid Replenishing Lotion  Keri Anti-Bacterial Hand Lotion  Keri Deep Conditioning Original Lotion Dry Skin Formula Softly Scented  Keri Deep Conditioning Original Lotion, Fragrance Free Sensitive Skin Formula  Keri Lotion Fast Absorbing Fragrance Free Sensitive Skin Formula  Keri Lotion Fast Absorbing Softly Scented Dry Skin Formula  Keri Original Lotion  Keri Skin Renewal Lotion Keri Silky Smooth Lotion  Keri Silky Smooth Sensitive Skin Lotion  Nivea Body Creamy Conditioning Oil  Nivea Body Extra Enriched Teacher, adult education Moisturizing Lotion Nivea Crme  Nivea Skin Firming Lotion  NutraDerm 30 Skin Lotion  NutraDerm Skin Lotion  NutraDerm Therapeutic Skin Cream  NutraDerm Therapeutic Skin Lotion  ProShield Protective Hand Cream  WHAT IS A BLOOD TRANSFUSION? Blood  Transfusion Information  A transfusion is the replacement of blood or some of its parts. Blood is made up of multiple cells which provide different functions. Red blood cells carry oxygen and are used for blood loss replacement. White blood cells fight against infection. Platelets control bleeding. Plasma helps clot blood. Other blood products are available for specialized needs, such as hemophilia or other clotting disorders. BEFORE THE TRANSFUSION  Who gives blood for transfusions?  Healthy volunteers who are fully evaluated to make sure their blood is safe. This is blood bank blood. Transfusion therapy is the safest it has ever been in the practice of medicine. Before blood is taken from a donor, a complete history is taken to make sure that person has no history of diseases nor engages in risky social behavior (examples are intravenous drug use or sexual activity with multiple partners). The donor's travel history is screened to minimize risk of transmitting infections, such as malaria. The donated blood is tested for  signs of infectious diseases, such as HIV and hepatitis. The blood is then tested to be sure it is compatible with you in order to minimize the chance of a transfusion reaction. If you or a relative donates blood, this is often done in anticipation of surgery and is not appropriate for emergency situations. It takes many days to process the donated blood. RISKS AND COMPLICATIONS Although transfusion therapy is very safe and saves many lives, the main dangers of transfusion include:  Getting an infectious disease. Developing a transfusion reaction. This is an allergic reaction to something in the blood you were given. Every precaution is taken to prevent this. The decision to have a blood transfusion has been considered carefully by your caregiver before blood is given. Blood is not given unless the benefits outweigh the risks. AFTER THE TRANSFUSION Right after receiving a blood  transfusion, you will usually feel much better and more energetic. This is especially true if your red blood cells have gotten low (anemic). The transfusion raises the level of the red blood cells which carry oxygen, and this usually causes an energy increase. The nurse administering the transfusion will monitor you carefully for complications. HOME CARE INSTRUCTIONS  No special instructions are needed after a transfusion. You may find your energy is better. Speak with your caregiver about any limitations on activity for underlying diseases you may have. SEEK MEDICAL CARE IF:  Your condition is not improving after your transfusion. You develop redness or irritation at the intravenous (IV) site. SEEK IMMEDIATE MEDICAL CARE IF:  Any of the following symptoms occur over the next 12 hours: Shaking chills. You have a temperature by mouth above 102 F (38.9 C), not controlled by medicine. Chest, back, or muscle pain. People around you feel you are not acting correctly or are confused. Shortness of breath or difficulty breathing. Dizziness and fainting. You get a rash or develop hives. You have a decrease in urine output. Your urine turns a dark color or changes to pink, red, or brown. Any of the following symptoms occur over the next 10 days: You have a temperature by mouth above 102 F (38.9 C), not controlled by medicine. Shortness of breath. Weakness after normal activity. The white part of the eye turns yellow (jaundice). You have a decrease in the amount of urine or are urinating less often. Your urine turns a dark color or changes to pink, red, or brown. Document Released: 09/04/2000 Document Revised: 11/30/2011 Document Reviewed: 04/23/2008 Exit[employer] Patient Information 2014 [place], [place].  _______________________________________________________________________  Incentive Spirometer  An incentive spirometer is a tool that can help keep your lungs clear and active. This tool  measures how well you are filling your lungs with each breath. Taking long deep breaths may help reverse or decrease the chance of developing breathing (pulmonary) problems (especially infection) following: A long period of time when you are unable to move or be active. BEFORE THE PROCEDURE  If the spirometer includes an indicator to show your best effort, your nurse or respiratory therapist will set it to a desired goal. If possible, sit up straight or lean slightly forward. Try not to slouch. Hold the incentive spirometer in an upright position. INSTRUCTIONS FOR USE  Sit on the edge of your bed if possible, or sit up as far as you can in bed or on a chair. Hold the incentive spirometer in an upright position. Breathe out normally. Place the mouthpiece in your mouth and seal your lips tightly around it. Breathe in slowly  and as deeply as possible, raising the piston or the ball toward the top of the column. Hold your breath for 3-5 seconds or for as long as possible. Allow the piston or ball to fall to the bottom of the column. Remove the mouthpiece from your mouth and breathe out normally. Rest for a few seconds and repeat Steps 1 through 7 at least 10 times every 1-2 hours when you are awake. Take your time and take a few normal breaths between deep breaths. The spirometer may include an indicator to show your best effort. Use the indicator as a goal to work toward during each repetition. After each set of 10 deep breaths, practice coughing to be sure your lungs are clear. If you have an incision (the cut made at the time of surgery), support your incision when coughing by placing a pillow or rolled up towels firmly against it. Once you are able to get out of bed, walk around indoors and cough well. You may stop using the incentive spirometer when instructed by your caregiver.  RISKS AND COMPLICATIONS Take your time so you do not get dizzy or light-headed. If you are in pain, you may need to  take or ask for pain medication before doing incentive spirometry. It is harder to take a deep breath if you are having pain. AFTER USE Rest and breathe slowly and easily. It can be helpful to keep track of a log of your progress. Your caregiver can provide you with a simple table to help with this. If you are using the spirometer at home, follow these instructions: SEEK MEDICAL CARE IF:  You are having difficultly using the spirometer. You have trouble using the spirometer as often as instructed. Your pain medication is not giving enough relief while using the spirometer. You develop fever of 100.5 F (38.1 C) or higher. SEEK IMMEDIATE MEDICAL CARE IF:  You cough up bloody sputum that had not been present before. You develop fever of 102 F (38.9 C) or greater. You develop worsening pain at or near the incision site. MAKE SURE YOU:  Understand these instructions. Will watch your condition. Will get help right away if you are not doing well or get worse. Document Released: 01/18/2007 Document Revised: 11/30/2011 Document Reviewed: 03/21/2007 [facility] Patient Information 2014 [place], [place].   ________________________________________________________________________

## 2024-04-26 NOTE — Progress Notes (Addendum)
 PCP - Sharlet Speaks, MD clearance in media Cardiologist - Redell Leiter, MD LOV 09-08-2019  saw as a check up after a friend passed unexpectedly.  PPM/ICD -  Device Orders -  Rep Notified -   Chest x-ray - CTA chest 2020  epic EKG -  Stress Test - 2020 epic ECHO - 2020 epic Cardiac Cath -   Sleep Study -  CPAP - no  Fasting Blood Sugar -  Checks Blood Sugar __o___ times a day  Blood Thinner Instructions:n/a Aspirin Instructions:n/a  ERAS Protcol - PRE-SURGERY Ensure      COVID vaccine -no  Activity--Able to climb a flight of stairs with no CP or SOB  Anesthesia review: HTN, OSA no CPAP  Patient denies shortness of breath, fever, cough and chest pain at PAT appointment   All instructions explained to the patient, with a verbal understanding of the material. Patient agrees to go over the instructions while at home for a better understanding. Patient also instructed to self quarantine after being tested for COVID-19. The opportunity to ask questions was provided.

## 2024-05-01 ENCOUNTER — Encounter (HOSPITAL_COMMUNITY)
Admission: RE | Admit: 2024-05-01 | Discharge: 2024-05-01 | Disposition: A | Discharge status: Home / Self Care | Source: Ambulatory Visit | Attending: Orthopedic Surgery | Admitting: Orthopedic Surgery

## 2024-05-01 ENCOUNTER — Encounter (HOSPITAL_COMMUNITY): Payer: Self-pay

## 2024-05-01 ENCOUNTER — Other Ambulatory Visit: Payer: Self-pay

## 2024-05-01 VITALS — BP 151/86 | HR 98 | Temp 98.6°F | Resp 16 | Ht 72.0 in | Wt 258.0 lb

## 2024-05-01 DIAGNOSIS — I10 Essential (primary) hypertension: Secondary | ICD-10-CM | POA: Diagnosis not present

## 2024-05-01 DIAGNOSIS — R9431 Abnormal electrocardiogram [ECG] [EKG]: Secondary | ICD-10-CM | POA: Diagnosis not present

## 2024-05-01 DIAGNOSIS — Z01818 Encounter for other preprocedural examination: Secondary | ICD-10-CM | POA: Insufficient documentation

## 2024-05-01 HISTORY — DX: Unspecified osteoarthritis, unspecified site: M19.90

## 2024-05-01 LAB — BASIC METABOLIC PANEL WITH GFR
Anion gap: 14 (ref 5–15)
BUN: 14 mg/dL (ref 8–23)
CO2: 21 mmol/L — ABNORMAL LOW (ref 22–32)
Calcium: 9.7 mg/dL (ref 8.9–10.3)
Chloride: 103 mmol/L (ref 98–111)
Creatinine, Ser: 1 mg/dL (ref 0.61–1.24)
GFR, Estimated: >60 mL/min (ref >60)
Glucose, Bld: 141 mg/dL — ABNORMAL HIGH (ref 70–99)
Potassium: 4.2 mmol/L (ref 3.5–5.1)
Sodium: 138 mmol/L (ref 135–145)

## 2024-05-01 LAB — CBC
HCT: 51.8 % (ref 39.0–52.0)
Hemoglobin: 17.1 g/dL — ABNORMAL HIGH (ref 13.0–17.0)
MCH: 30.2 pg (ref 26.0–34.0)
MCHC: 33 g/dL (ref 30.0–36.0)
MCV: 91.4 fL (ref 80.0–100.0)
Platelets: 383 K/uL (ref 150–400)
RBC: 5.67 MIL/uL (ref 4.22–5.81)
RDW: 13.4 % (ref 11.5–15.5)
WBC: 10 K/uL (ref 4.0–10.5)
nRBC: 0 % (ref 0.0–0.2)

## 2024-05-01 LAB — SURGICAL PCR SCREEN
MRSA, PCR: NEGATIVE
Staphylococcus aureus: NEGATIVE

## 2024-05-09 NOTE — Anesthesia Preprocedure Evaluation (Signed)
 Anesthesia Evaluation  Patient identified by MRN, date of birth, ID band Patient awake    Reviewed: Allergy & Precautions, NPO status , Patient's Chart, lab work & pertinent test results  Airway Mallampati: II  TM Distance: >3 FB Neck ROM: Full    Dental no notable dental hx. (+) Teeth Intact, Dental Advisory Given   Pulmonary sleep apnea    Pulmonary exam normal breath sounds clear to auscultation       Cardiovascular hypertension, (-) angina (-) Past MI Normal cardiovascular exam Rhythm:Regular Rate:Normal  2020 TTE  1. Left ventricular ejection fraction, by visual estimation, is 60 to  65%. The left ventricle has normal function. There is moderately increased  left ventricular hypertrophy.   2. The left ventricle has no regional wall motion abnormalities.   3. Global right ventricle has normal systolic function.The right  ventricular size is normal. No increase in right ventricular wall  thickness.   4. Left atrial size was normal.   5. Right atrial size was normal.   6. The mitral valve is normal in structure. No evidence of mitral valve  regurgitation.   7. The tricuspid valve is normal in structure. Tricuspid valve  regurgitation is trivial.   8. The aortic valve is tricuspid. Aortic valve regurgitation is not  visualized. No evidence of aortic valve sclerosis or stenosis.   9. The pulmonic valve was normal in structure. Pulmonic valve  regurgitation is not visualized.  10. Aneurysm of the ascending aorta.  11. Aortic dilatation noted.  12. There is mild to moderate dilatation of the ascending aorta measuring  39 mm.  13. The atrial septum is grossly normal.      Neuro/Psych negative neurological ROS  negative psych ROS   GI/Hepatic Neg liver ROS,GERD  Controlled and Medicated,,  Endo/Other  negative endocrine ROS    Renal/GU Bladder CA Lab Results      Component                Value               Date                           K                        4.2                 05/01/2024                CO2                      21 (L)              05/01/2024                BUN                      14                  05/01/2024                CREATININE               1.00                05/01/2024               GLUCOSE  141 (H)             05/01/2024              BPH    Musculoskeletal  (+) Arthritis , Osteoarthritis,    Abdominal   Peds  Hematology Lab Results      Component                Value               Date                      WBC                      10.0                05/01/2024                HGB                      17.1 (H)            05/01/2024                HCT                      51.8                05/01/2024               PLT                      383                 05/01/2024              Anesthesia Other Findings All: Nasonex  Reproductive/Obstetrics                              Anesthesia Physical Anesthesia Plan  ASA: 3  Anesthesia Plan: Spinal   Post-op Pain Management: Ofirmev  IV (intra-op)* and Minimal or no pain anticipated   Induction:   PONV Risk Score and Plan: Propofol  infusion, Treatment may vary due to age or medical condition, Midazolam  and Ondansetron   Airway Management Planned: Nasal Cannula and Natural Airway  Additional Equipment: None  Intra-op Plan:   Post-operative Plan:   Informed Consent: I have reviewed the patients History and Physical, chart, labs and discussed the procedure including the risks, benefits and alternatives for the proposed anesthesia with the patient or authorized representative who has indicated his/her understanding and acceptance.     Dental advisory given  Plan Discussed with: CRNA and Surgeon  Anesthesia Plan Comments: (Spinal)         Anesthesia Quick Evaluation

## 2024-05-10 ENCOUNTER — Observation Stay (HOSPITAL_COMMUNITY)
Admission: RE | Admit: 2024-05-10 | Discharge: 2024-05-11 | Disposition: A | Discharge status: Home / Self Care | Source: Ambulatory Visit | Attending: Orthopedic Surgery | Admitting: Orthopedic Surgery

## 2024-05-10 ENCOUNTER — Ambulatory Visit (HOSPITAL_COMMUNITY)

## 2024-05-10 ENCOUNTER — Other Ambulatory Visit: Payer: Self-pay

## 2024-05-10 ENCOUNTER — Observation Stay (HOSPITAL_COMMUNITY)

## 2024-05-10 ENCOUNTER — Encounter (HOSPITAL_COMMUNITY)
Admission: RE | Disposition: A | Discharge status: Home / Self Care | Payer: Self-pay | Source: Ambulatory Visit | Attending: Orthopedic Surgery

## 2024-05-10 ENCOUNTER — Ambulatory Visit (HOSPITAL_COMMUNITY): Payer: Self-pay | Admitting: Medical

## 2024-05-10 ENCOUNTER — Ambulatory Visit (HOSPITAL_BASED_OUTPATIENT_CLINIC_OR_DEPARTMENT_OTHER): Admitting: Anesthesiology

## 2024-05-10 ENCOUNTER — Encounter (HOSPITAL_COMMUNITY): Payer: Self-pay | Admitting: Orthopedic Surgery

## 2024-05-10 DIAGNOSIS — Z79899 Other long term (current) drug therapy: Secondary | ICD-10-CM | POA: Insufficient documentation

## 2024-05-10 DIAGNOSIS — G4730 Sleep apnea, unspecified: Secondary | ICD-10-CM

## 2024-05-10 DIAGNOSIS — M1611 Unilateral primary osteoarthritis, right hip: Principal | ICD-10-CM | POA: Insufficient documentation

## 2024-05-10 DIAGNOSIS — Z85820 Personal history of malignant melanoma of skin: Secondary | ICD-10-CM | POA: Diagnosis not present

## 2024-05-10 DIAGNOSIS — I10 Essential (primary) hypertension: Secondary | ICD-10-CM | POA: Insufficient documentation

## 2024-05-10 DIAGNOSIS — Z8551 Personal history of malignant neoplasm of bladder: Secondary | ICD-10-CM | POA: Diagnosis not present

## 2024-05-10 DIAGNOSIS — M169 Osteoarthritis of hip, unspecified: Principal | ICD-10-CM | POA: Diagnosis present

## 2024-05-10 DIAGNOSIS — M25551 Pain in right hip: Secondary | ICD-10-CM | POA: Diagnosis present

## 2024-05-10 HISTORY — PX: TOTAL HIP ARTHROPLASTY: SHX124

## 2024-05-10 LAB — TYPE AND SCREEN
ABO/RH(D): B POS
Antibody Screen: NEGATIVE

## 2024-05-10 LAB — ABO/RH: ABO/RH(D): B POS

## 2024-05-10 SURGERY — ARTHROPLASTY, HIP, TOTAL, ANTERIOR APPROACH
Anesthesia: Spinal | Site: Hip | Laterality: Right

## 2024-05-10 MED ORDER — CHLORHEXIDINE GLUCONATE 0.12 % MT SOLN
15.0000 mL | Freq: Once | OROMUCOSAL | Status: AC
  Administered 2024-05-10: 15 mL via OROMUCOSAL

## 2024-05-10 MED ORDER — OXYCODONE HCL 5 MG PO TABS
5.0000 mg | ORAL_TABLET | Freq: Once | ORAL | Status: AC | PRN
  Administered 2024-05-10: 5 mg via ORAL

## 2024-05-10 MED ORDER — BISACODYL 10 MG RE SUPP: 10.0000 mg | Freq: Every day | RECTAL | Status: DC | PRN

## 2024-05-10 MED ORDER — TRANEXAMIC ACID-NACL 1000-0.7 MG/100ML-% IV SOLN
1000.0000 mg | INTRAVENOUS | Status: DC
  Filled 2024-05-10: qty 100

## 2024-05-10 MED ORDER — ACETAMINOPHEN 10 MG/ML IV SOLN: 1000.0000 mg | Freq: Once | INTRAVENOUS | Status: DC | PRN

## 2024-05-10 MED ORDER — DEXAMETHASONE SODIUM PHOSPHATE 10 MG/ML IJ SOLN: 8.0000 mg | Freq: Once | INTRAMUSCULAR | Status: DC

## 2024-05-10 MED ORDER — MIDAZOLAM HCL 2 MG/2ML IJ SOLN
INTRAMUSCULAR | Status: DC | PRN
  Administered 2024-05-10: 2 mg via INTRAVENOUS

## 2024-05-10 MED ORDER — POVIDONE-IODINE 10 % EX SWAB: 2.0000 | Freq: Once | CUTANEOUS | Status: DC

## 2024-05-10 MED ORDER — ALBUMIN HUMAN 5 % IV SOLN
INTRAVENOUS | Status: DC | PRN
Start: 2024-05-10 — End: 2024-05-10

## 2024-05-10 MED ORDER — CEFAZOLIN SODIUM-DEXTROSE 2-4 GM/100ML-% IV SOLN
2.0000 g | Freq: Four times a day (QID) | INTRAVENOUS | Status: AC
  Administered 2024-05-10 – 2024-05-11 (×2): 2 g via INTRAVENOUS
  Filled 2024-05-10 (×2): qty 100

## 2024-05-10 MED ORDER — TRAMADOL HCL 50 MG PO TABS
50.0000 mg | ORAL_TABLET | Freq: Four times a day (QID) | ORAL | Status: DC | PRN
  Administered 2024-05-11: 100 mg via ORAL
  Filled 2024-05-10: qty 2

## 2024-05-10 MED ORDER — METOCLOPRAMIDE HCL 5 MG/ML IJ SOLN: 5.0000 mg | Freq: Three times a day (TID) | INTRAMUSCULAR | Status: DC | PRN

## 2024-05-10 MED ORDER — OXYCODONE HCL 5 MG/5ML PO SOLN: 5.0000 mg | Freq: Once | ORAL | Status: AC | PRN

## 2024-05-10 MED ORDER — ACETAMINOPHEN 10 MG/ML IV SOLN
INTRAVENOUS | Status: AC
Start: 2024-05-10 — End: 2024-05-10
  Filled 2024-05-10: qty 100

## 2024-05-10 MED ORDER — OXYCODONE HCL 5 MG PO TABS
ORAL_TABLET | ORAL | Status: AC
  Filled 2024-05-10: qty 1

## 2024-05-10 MED ORDER — ALBUMIN HUMAN 5 % IV SOLN
INTRAVENOUS | Status: AC
Start: 2024-05-10 — End: 2024-05-10
  Filled 2024-05-10: qty 250

## 2024-05-10 MED ORDER — WATER FOR IRRIGATION, STERILE IR SOLN
Status: DC | PRN
Start: 2024-05-10 — End: 2024-05-10
  Administered 2024-05-10: 2000 mL

## 2024-05-10 MED ORDER — ROCURONIUM BROMIDE 10 MG/ML (PF) SYRINGE
PREFILLED_SYRINGE | INTRAVENOUS | Status: DC | PRN
  Administered 2024-05-10: 70 mg via INTRAVENOUS
  Administered 2024-05-10: 20 mg via INTRAVENOUS

## 2024-05-10 MED ORDER — HYDROCODONE-ACETAMINOPHEN 5-325 MG PO TABS
1.0000 | ORAL_TABLET | ORAL | Status: DC | PRN
  Administered 2024-05-10: 1 via ORAL
  Administered 2024-05-11 (×3): 2 via ORAL
  Filled 2024-05-10: qty 2
  Filled 2024-05-10: qty 1
  Filled 2024-05-10 (×2): qty 2

## 2024-05-10 MED ORDER — POLYETHYLENE GLYCOL 3350 17 G PO PACK
17.0000 g | PACK | Freq: Every day | ORAL | Status: DC | PRN
Start: 2024-05-10 — End: 2024-05-11

## 2024-05-10 MED ORDER — LIDOCAINE HCL (PF) 2 % IJ SOLN
INTRAMUSCULAR | Status: DC | PRN
  Administered 2024-05-10: 100 mg via INTRADERMAL

## 2024-05-10 MED ORDER — MENTHOL 3 MG MT LOZG: 1.0000 | LOZENGE | OROMUCOSAL | Status: DC | PRN

## 2024-05-10 MED ORDER — MIDAZOLAM HCL 2 MG/2ML IJ SOLN
INTRAMUSCULAR | Status: AC
  Filled 2024-05-10: qty 2

## 2024-05-10 MED ORDER — PHENYLEPHRINE 80 MCG/ML (10ML) SYRINGE FOR IV PUSH (FOR BLOOD PRESSURE SUPPORT)
PREFILLED_SYRINGE | INTRAVENOUS | Status: AC
  Filled 2024-05-10: qty 10

## 2024-05-10 MED ORDER — HYDROMORPHONE HCL 1 MG/ML IJ SOLN
0.5000 mg | INTRAMUSCULAR | Status: DC | PRN
  Administered 2024-05-10 (×2): 0.5 mg via INTRAVENOUS

## 2024-05-10 MED ORDER — BUPIVACAINE-EPINEPHRINE (PF) 0.25% -1:200000 IJ SOLN
INTRAMUSCULAR | Status: AC
  Filled 2024-05-10: qty 30

## 2024-05-10 MED ORDER — HYDROMORPHONE HCL 1 MG/ML IJ SOLN
INTRAMUSCULAR | Status: AC
  Filled 2024-05-10: qty 1

## 2024-05-10 MED ORDER — LACTATED RINGERS IV SOLN: INTRAVENOUS | Status: DC

## 2024-05-10 MED ORDER — FENTANYL CITRATE (PF) 100 MCG/2ML IJ SOLN
INTRAMUSCULAR | Status: DC | PRN
  Administered 2024-05-10 (×2): 25 ug via INTRAVENOUS
  Administered 2024-05-10 (×3): 50 ug via INTRAVENOUS

## 2024-05-10 MED ORDER — ONDANSETRON HCL 4 MG/2ML IJ SOLN
4.0000 mg | Freq: Four times a day (QID) | INTRAMUSCULAR | Status: DC | PRN
Start: 2024-05-10 — End: 2024-05-11

## 2024-05-10 MED ORDER — METHOCARBAMOL 500 MG PO TABS
ORAL_TABLET | ORAL | Status: AC
  Filled 2024-05-10: qty 1

## 2024-05-10 MED ORDER — MAGNESIUM CITRATE PO SOLN: 1.0000 | Freq: Once | ORAL | Status: DC | PRN

## 2024-05-10 MED ORDER — MORPHINE SULFATE (PF) 2 MG/ML IV SOLN: 0.5000 mg | INTRAVENOUS | Status: DC | PRN

## 2024-05-10 MED ORDER — SUGAMMADEX SODIUM 200 MG/2ML IV SOLN
INTRAVENOUS | Status: DC | PRN
  Administered 2024-05-10: 250 mg via INTRAVENOUS

## 2024-05-10 MED ORDER — ONDANSETRON HCL 4 MG/2ML IJ SOLN: 4.0000 mg | Freq: Once | INTRAMUSCULAR | Status: DC | PRN

## 2024-05-10 MED ORDER — ORAL CARE MOUTH RINSE: 15.0000 mL | Freq: Once | OROMUCOSAL | Status: AC

## 2024-05-10 MED ORDER — ONDANSETRON HCL 4 MG/2ML IJ SOLN
INTRAMUSCULAR | Status: AC
  Filled 2024-05-10: qty 2

## 2024-05-10 MED ORDER — TRANEXAMIC ACID-NACL 1000-0.7 MG/100ML-% IV SOLN
INTRAVENOUS | Status: DC | PRN
Start: 2024-05-10 — End: 2024-05-10
  Administered 2024-05-10: 1000 mg via INTRAVENOUS

## 2024-05-10 MED ORDER — SODIUM CHLORIDE 0.9 % IV SOLN
INTRAVENOUS | Status: DC
Start: 2024-05-10 — End: 2024-05-11

## 2024-05-10 MED ORDER — HYDROMORPHONE HCL 1 MG/ML IJ SOLN
0.2500 mg | INTRAMUSCULAR | Status: DC | PRN
  Administered 2024-05-10 (×4): 0.5 mg via INTRAVENOUS

## 2024-05-10 MED ORDER — DEXAMETHASONE SODIUM PHOSPHATE 10 MG/ML IJ SOLN
10.0000 mg | Freq: Once | INTRAMUSCULAR | Status: AC
  Administered 2024-05-11: 10 mg via INTRAVENOUS
  Filled 2024-05-10: qty 1

## 2024-05-10 MED ORDER — FENTANYL CITRATE (PF) 100 MCG/2ML IJ SOLN
INTRAMUSCULAR | Status: AC
  Filled 2024-05-10: qty 2

## 2024-05-10 MED ORDER — IRBESARTAN 150 MG PO TABS
150.0000 mg | ORAL_TABLET | Freq: Every day | ORAL | Status: DC
  Administered 2024-05-10 – 2024-05-11 (×2): 150 mg via ORAL
  Filled 2024-05-10 (×2): qty 1

## 2024-05-10 MED ORDER — LIDOCAINE HCL (PF) 2 % IJ SOLN
INTRAMUSCULAR | Status: AC
  Filled 2024-05-10: qty 5

## 2024-05-10 MED ORDER — PHENOL 1.4 % MT LIQD: 1.0000 | OROMUCOSAL | Status: DC | PRN

## 2024-05-10 MED ORDER — RIVAROXABAN 10 MG PO TABS
10.0000 mg | ORAL_TABLET | Freq: Every day | ORAL | Status: DC
  Administered 2024-05-11: 10 mg via ORAL
  Filled 2024-05-10: qty 1

## 2024-05-10 MED ORDER — PROPOFOL 1000 MG/100ML IV EMUL
INTRAVENOUS | Status: AC
  Filled 2024-05-10: qty 100

## 2024-05-10 MED ORDER — ONDANSETRON HCL 4 MG PO TABS: 4.0000 mg | ORAL_TABLET | Freq: Four times a day (QID) | ORAL | Status: DC | PRN

## 2024-05-10 MED ORDER — METOCLOPRAMIDE HCL 5 MG PO TABS: 5.0000 mg | ORAL_TABLET | Freq: Three times a day (TID) | ORAL | Status: DC | PRN

## 2024-05-10 MED ORDER — BUPIVACAINE-EPINEPHRINE (PF) 0.25% -1:200000 IJ SOLN
INTRAMUSCULAR | Status: DC | PRN
  Administered 2024-05-10: 30 mL

## 2024-05-10 MED ORDER — HYDROMORPHONE HCL 1 MG/ML IJ SOLN
INTRAMUSCULAR | Status: AC
  Filled 2024-05-10: qty 2

## 2024-05-10 MED ORDER — DOCUSATE SODIUM 100 MG PO CAPS
100.0000 mg | ORAL_CAPSULE | Freq: Two times a day (BID) | ORAL | Status: DC
Start: 2024-05-10 — End: 2024-05-11
  Administered 2024-05-10 – 2024-05-11 (×2): 100 mg via ORAL
  Filled 2024-05-10 (×2): qty 1

## 2024-05-10 MED ORDER — AMISULPRIDE (ANTIEMETIC) 5 MG/2ML IV SOLN: 10.0000 mg | Freq: Once | INTRAVENOUS | Status: DC | PRN

## 2024-05-10 MED ORDER — 0.9 % SODIUM CHLORIDE (POUR BTL) OPTIME
TOPICAL | Status: DC | PRN
  Administered 2024-05-10: 1000 mL

## 2024-05-10 MED ORDER — ONDANSETRON HCL 4 MG/2ML IJ SOLN
INTRAMUSCULAR | Status: DC | PRN
  Administered 2024-05-10: 4 mg via INTRAVENOUS

## 2024-05-10 MED ORDER — DEXAMETHASONE SODIUM PHOSPHATE 10 MG/ML IJ SOLN
INTRAMUSCULAR | Status: DC | PRN
  Administered 2024-05-10: 8 mg via INTRAVENOUS

## 2024-05-10 MED ORDER — SUGAMMADEX SODIUM 200 MG/2ML IV SOLN
INTRAVENOUS | Status: AC
  Filled 2024-05-10: qty 4

## 2024-05-10 MED ORDER — LACTATED RINGERS IV SOLN: INTRAVENOUS | Status: DC | PRN

## 2024-05-10 MED ORDER — METHOCARBAMOL 500 MG PO TABS
500.0000 mg | ORAL_TABLET | Freq: Four times a day (QID) | ORAL | Status: DC | PRN
  Administered 2024-05-10 – 2024-05-11 (×3): 500 mg via ORAL
  Filled 2024-05-10 (×2): qty 1

## 2024-05-10 MED ORDER — ACETAMINOPHEN 10 MG/ML IV SOLN
INTRAVENOUS | Status: DC | PRN
Start: 2024-05-10 — End: 2024-05-10
  Administered 2024-05-10: 1000 mg via INTRAVENOUS

## 2024-05-10 MED ORDER — METHOCARBAMOL 1000 MG/10ML IJ SOLN: 500.0000 mg | Freq: Four times a day (QID) | INTRAMUSCULAR | Status: DC | PRN

## 2024-05-10 MED ORDER — ACETAMINOPHEN 325 MG PO TABS: 325.0000 mg | ORAL_TABLET | Freq: Four times a day (QID) | ORAL | Status: DC | PRN

## 2024-05-10 MED ORDER — DEXAMETHASONE SODIUM PHOSPHATE 10 MG/ML IJ SOLN
INTRAMUSCULAR | Status: AC
  Filled 2024-05-10: qty 1

## 2024-05-10 MED ORDER — FAMOTIDINE 20 MG PO TABS
20.0000 mg | ORAL_TABLET | Freq: Every day | ORAL | Status: DC
  Administered 2024-05-11: 20 mg via ORAL
  Filled 2024-05-10: qty 1

## 2024-05-10 MED ORDER — CEFAZOLIN SODIUM-DEXTROSE 2-4 GM/100ML-% IV SOLN
2.0000 g | INTRAVENOUS | Status: AC
  Administered 2024-05-10: 2 g via INTRAVENOUS
  Filled 2024-05-10: qty 100

## 2024-05-10 MED ORDER — ACETAMINOPHEN 10 MG/ML IV SOLN
1000.0000 mg | Freq: Four times a day (QID) | INTRAVENOUS | Status: DC
  Filled 2024-05-10: qty 100

## 2024-05-10 SURGICAL SUPPLY — 36 items
BAG COUNTER SPONGE SURGICOUNT (BAG) IMPLANT
BAG ZIPLOCK 12X15 (MISCELLANEOUS) IMPLANT
BLADE SAG 18X100X1.27 (BLADE) ×2 IMPLANT
COVER PERINEAL POST (MISCELLANEOUS) ×2 IMPLANT
COVER SURGICAL LIGHT HANDLE (MISCELLANEOUS) ×2 IMPLANT
CUP ACETBLR 54 OD PINNACLE (Hips) IMPLANT
DERMABOND ADVANCED .7 DNX12 (GAUZE/BANDAGES/DRESSINGS) ×2 IMPLANT
DRAPE FOOT SWITCH (DRAPES) ×2 IMPLANT
DRAPE STERI IOBAN 125X83 (DRAPES) ×2 IMPLANT
DRAPE U-SHAPE 47X51 STRL (DRAPES) ×4 IMPLANT
DRSG AQUACEL AG ADV 3.5X10 (GAUZE/BANDAGES/DRESSINGS) ×2 IMPLANT
DURAPREP 26ML APPLICATOR (WOUND CARE) ×2 IMPLANT
ELECT REM PT RETURN 15FT ADLT (MISCELLANEOUS) ×2 IMPLANT
GLOVE BIO SURGEON STRL SZ 6.5 (GLOVE) IMPLANT
GLOVE BIO SURGEON STRL SZ7 (GLOVE) IMPLANT
GLOVE BIO SURGEON STRL SZ8 (GLOVE) ×2 IMPLANT
GLOVE BIOGEL PI IND STRL 7.0 (GLOVE) IMPLANT
GLOVE BIOGEL PI IND STRL 8 (GLOVE) ×2 IMPLANT
GOWN STRL REUS W/ TWL LRG LVL3 (GOWN DISPOSABLE) ×4 IMPLANT
HEAD CERAMIC 36 PLUS5 (Hips) IMPLANT
HOLDER FOLEY CATH W/STRAP (MISCELLANEOUS) ×2 IMPLANT
KIT TURNOVER KIT A (KITS) ×2 IMPLANT
LINER NEUTRAL 54X36MM PLUS 4 (Hips) IMPLANT
MANIFOLD NEPTUNE II (INSTRUMENTS) ×2 IMPLANT
PACK ANTERIOR HIP CUSTOM (KITS) ×2 IMPLANT
PENCIL SMOKE EVACUATOR COATED (MISCELLANEOUS) ×2 IMPLANT
SPIKE FLUID TRANSFER (MISCELLANEOUS) ×2 IMPLANT
STEM FEMORAL SZ9 STD ACTIS (Stem) IMPLANT
SUT ETHIBOND NAB CT1 #1 30IN (SUTURE) ×2 IMPLANT
SUT MNCRL AB 4-0 PS2 18 (SUTURE) ×2 IMPLANT
SUT VIC AB 2-0 CT1 TAPERPNT 27 (SUTURE) ×4 IMPLANT
SUTURE STRATFX 0 PDS 27 VIOLET (SUTURE) ×2 IMPLANT
TOWEL GREEN STERILE FF (TOWEL DISPOSABLE) ×2 IMPLANT
TRAY FOLEY MTR SLVR 16FR STAT (SET/KITS/TRAYS/PACK) ×2 IMPLANT
TUBE SUCTION HIGH CAP CLEAR NV (SUCTIONS) ×2 IMPLANT
WATER STERILE IRR 1000ML POUR (IV SOLUTION) IMPLANT

## 2024-05-10 NOTE — Op Note (Signed)
 OPERATIVE REPORT- TOTAL HIP ARTHROPLASTY   PREOPERATIVE DIAGNOSIS: Osteoarthritis of the Right hip.   POSTOPERATIVE DIAGNOSIS: Osteoarthritis of the Right  hip.   PROCEDURE: Right total hip arthroplasty, anterior approach.   SURGEON: Dempsey Moan, MD   ASSISTANT: Roxie Mess, PA-C  ANESTHESIA:  Spinal  ESTIMATED BLOOD LOSS:-450 mL    DRAINS: None  COMPLICATIONS: None   CONDITION: PACU - hemodynamically stable.   BRIEF CLINICAL NOTE: Christian Douglas is a 63 y.o. male who has advanced end-  stage arthritis of their Right  hip with progressively worsening pain and  dysfunction.The patient has failed nonoperative management and presents for  total hip arthroplasty.   PROCEDURE IN DETAIL: After successful administration of spinal  anesthetic, the traction boots for the [facility] bed were placed on both  feet and the patient was placed onto the [facility] bed, boots placed into the leg  holders. The Right hip was then isolated from the perineum with plastic  drapes and prepped and draped in the usual sterile fashion. ASIS and  greater trochanter were marked and a oblique incision was made, starting  at about 1 cm lateral and 2 cm distal to the ASIS and coursing towards  the anterior cortex of the femur. The skin was cut with a 10 blade  through subcutaneous tissue to the level of the fascia overlying the  tensor fascia lata muscle. The fascia was then incised in line with the  incision at the junction of the anterior third and posterior 2/3rd. The  muscle was teased off the fascia and then the interval between the TFL  and the rectus was developed. The Hohmann retractor was then placed at  the top of the femoral neck over the capsule. The vessels overlying the  capsule were cauterized and the fat on top of the capsule was removed.  A Hohmann retractor was then placed anterior underneath the rectus  femoris to give exposure to the entire anterior capsule. A T-shaped   capsulotomy was performed. The edges were tagged and the femoral head  was identified.       Osteophytes are removed off the superior acetabulum.  The femoral neck was then cut in situ with an oscillating saw. Traction  was then applied to the left lower extremity utilizing the [facility]  traction. The femoral head was then removed. Retractors were placed  around the acetabulum and then circumferential removal of the labrum was  performed. Osteophytes were also removed. Reaming starts at 51 mm to  medialize and  Increased in 2 mm increments to 53 mm. We reamed in  approximately 40 degrees of abduction, 20 degrees anteversion. A 54 mm  pinnacle acetabular shell was then impacted in anatomic position under  fluoroscopic guidance with excellent purchase. We did not need to place  any additional dome screws. A 36 mm neutral + 4 Altrx liner was then  placed into the acetabular shell.       The femoral lift was then placed along the lateral aspect of the femur  just distal to the vastus ridge. The leg was  externally rotated and capsule  was stripped off the inferior aspect of the femoral neck down to the  level of the lesser trochanter, this was done with electrocautery. The femur was lifted after this was performed. The  leg was then placed in an extended and adducted position essentially delivering the femur. We also removed the capsule superiorly and the piriformis from the piriformis fossa to  gain excellent exposure of the  proximal femur. Rongeur was used to remove some cancellous bone to get  into the lateral portion of the proximal femur for placement of the  initial starter reamer. The starter broaches was placed  the starter broach  and was shown to go down the center of the canal. Broaching  with the Actis system was then performed starting at size 0  coursing  Up to size 9. A size 9 had excellent torsional and rotational  and axial stability. The trial standard offset neck was then placed   with a 36 + 5 trial head. The hip was then reduced. We confirmed that  the stem was in the canal both on AP and lateral x-rays. It also has excellent sizing. The hip was reduced with outstanding stability through full extension and full external rotation.. AP pelvis was taken and the leg lengths were measured and found to be equal. Hip was then dislocated again and the femoral head and neck removed. The  femoral broach was removed. Size 9 Actis stem with a standard offset  neck was then impacted into the femur following native anteversion. Has  excellent purchase in the canal. Excellent torsional and rotational and  axial stability. It is confirmed to be in the canal on AP and lateral  fluoroscopic views. The 36 + 5 ceramic head was placed and the hip  reduced with outstanding stability. Again AP pelvis was taken and it  confirmed that the leg lengths were equal. The wound was then copiously  irrigated with saline solution and the capsule reattached and repaired  with Ethibond suture. 30 ml of .25% Bupivicaine was  injected into the capsule and into the edge of the tensor fascia lata as well as subcutaneous tissue. The fascia overlying the tensor fascia lata was then closed with a running #1 V-Loc. Subcu was closed with interrupted 2-0 Vicryl and subcuticular running 4-0 Monocryl. Incision was cleaned  and dried. Steri-Strips and a bulky sterile dressing applied. The patient was awakened and transported to  recovery in stable condition.        Please note that a surgical assistant was a medical necessity for this procedure to perform it in a safe and expeditious manner. Assistant was necessary to provide appropriate retraction of vital neurovascular structures and to prevent femoral fracture and allow for anatomic placement of the prosthesis.  Dempsey Moan, M.D.

## 2024-05-10 NOTE — Anesthesia Postprocedure Evaluation (Signed)
 Anesthesia Post Note  Patient: Christian Douglas  Procedure(s) Performed: ARTHROPLASTY, HIP, TOTAL, ANTERIOR APPROACH (Right: Hip)     Patient location during evaluation: PACU Anesthesia Type: Spinal Level of consciousness: awake and alert Pain management: pain level controlled Vital Signs Assessment: post-procedure vital signs reviewed and stable Respiratory status: spontaneous breathing, nonlabored ventilation, respiratory function stable and patient connected to nasal cannula oxygen Cardiovascular status: blood pressure returned to baseline and stable Postop Assessment: no apparent nausea or vomiting Anesthetic complications: no   No notable events documented.  Last Vitals:  Vitals:   05/10/24 1745 05/10/24 1816  BP: 116/64 (!) 154/80  Pulse: 80 82  Resp: 12   Temp:    SpO2: 99% 95%    Last Pain:  Vitals:   05/10/24 1816  TempSrc:   PainSc: 8                  Garnette DELENA Gab

## 2024-05-10 NOTE — Interval H&P Note (Signed)
 History and Physical Interval Note:  05/10/2024 1:41 PM  Christian Douglas  has presented today for surgery, with the diagnosis of Right Hip Osteoarthritis.  The various methods of treatment have been discussed with the patient and family. After consideration of risks, benefits and other options for treatment, the patient has consented to  Procedure(s): ARTHROPLASTY, HIP, TOTAL, ANTERIOR APPROACH (Right) as a surgical intervention.  The patient's history has been reviewed, patient examined, no change in status, stable for surgery.  I have reviewed the patient's chart and labs.  Questions were answered to the patient's satisfaction.     Dempsey Navia Lindahl

## 2024-05-10 NOTE — Transfer of Care (Signed)
 Immediate Anesthesia Transfer of Care Note  Patient: Christian Douglas  Procedure(s) Performed: ARTHROPLASTY, HIP, TOTAL, ANTERIOR APPROACH (Right: Hip)  Patient Location: PACU  Anesthesia Type:General  Level of Consciousness: awake, alert , and oriented  Airway & Oxygen Therapy: Patient Spontanous Breathing and Patient connected to nasal cannula oxygen  Post-op Assessment: Report given to RN and Post -op Vital signs reviewed and stable  Post vital signs: Reviewed and stable  Last Vitals:  Vitals Value Taken Time  BP 136/66 05/10/24 16:22  Temp    Pulse 87 05/10/24 16:25  Resp 21 05/10/24 16:25  SpO2 97 % 05/10/24 16:25  Vitals shown include unfiled device data.  Last Pain:  Vitals:   05/10/24 1128  TempSrc: Oral         Complications: No notable events documented.

## 2024-05-10 NOTE — Discharge Instructions (Addendum)
 Dempsey Moan, MD Total Joint Specialist [facility] [address] [place], [place] [address] [phone]  ANTERIOR APPROACH TOTAL HIP REPLACEMENT POSTOPERATIVE DIRECTIONS     Hip Rehabilitation, Guidelines Following Surgery  The results of a hip operation are greatly improved after range of motion and muscle strengthening exercises. Follow all safety measures which are given to protect your hip. If any of these exercises cause increased pain or swelling in your joint, decrease the amount until you are comfortable again. Then slowly increase the exercises. Call your caregiver if you have problems or questions.   BLOOD CLOT PREVENTION Take a 10 mg Xarelto  once a day for three weeks following surgery. Then take an 81 mg Aspirin once a day for three weeks. Then discontinue Aspirin. You may resume your vitamins/supplements once you have discontinued the Xarelto . Do not take any NSAIDs (Advil, Aleve, Ibuprofen, Meloxicam, etc.) until you have discontinued the Xarelto .   HOME CARE INSTRUCTIONS  Remove items at home which could result in a fall. This includes throw rugs or furniture in walking pathways.  ICE to the affected hip as frequently as 20-30 minutes an hour and then as needed for pain and swelling. Continue to use ice on the hip for pain and swelling from surgery. You may notice swelling that will progress down to the foot and ankle. This is normal after surgery. Elevate the leg when you are not up walking on it.   Continue to use the breathing machine which will help keep your temperature down.  It is common for your temperature to cycle up and down following surgery, especially at night when you are not up moving around and exerting yourself.  The breathing machine keeps your lungs expanded and your temperature down.  DIET You may resume your previous home diet once your are discharged from the hospital.  DRESSING / WOUND CARE / SHOWERING You have an  adhesive waterproof bandage over the incision. Leave this in place until your first follow-up appointment. Once you remove this you will not need to place another bandage.  You may begin showering 3 days following surgery, but do not submerge the incision under water .  ACTIVITY For the first 3-5 days, it is important to rest and keep the operative leg elevated. You should, as a general rule, rest for 50 minutes and walk/stretch for 10 minutes per hour. After 5 days, you may slowly increase activity as tolerated.  Perform the exercises you were provided twice a day for about 15-20 minutes each session. Begin these 2 days following surgery. Walk with your walker as instructed. Use the walker until you are comfortable transitioning to a cane. Walk with the cane in the opposite hand of the operative leg. You may discontinue the cane once you are comfortable and walking steadily. Avoid periods of inactivity such as sitting longer than an hour when not asleep. This helps prevent blood clots.  Do not drive a car for 6 weeks or until released by your surgeon.  Do not drive while taking narcotics.  TED HOSE STOCKINGS Wear the elastic stockings on both legs for three weeks following surgery during the day. You may remove them at night while sleeping.  WEIGHT BEARING Weight bearing as tolerated with assist device (walker, cane, etc) as directed, use it as long as suggested by your surgeon or therapist, typically at least 4-6 weeks.  POSTOPERATIVE CONSTIPATION PROTOCOL Constipation - defined medically as fewer than three stools per week and severe constipation as less  than one stool per week.  One of the most common issues patients have following surgery is constipation.  Even if you have a regular bowel pattern at home, your normal regimen is likely to be disrupted due to multiple reasons following surgery.  Combination of anesthesia, postoperative narcotics, change in appetite and fluid intake all can  affect your bowels.  In order to avoid complications following surgery, here are some recommendations in order to help you during your recovery period.  Colace (docusate) - Pick up an over-the-counter form of Colace or another stool softener and take twice a day as long as you are requiring postoperative pain medications.  Take with a full glass of water  daily.  If you experience loose stools or diarrhea, hold the colace until you stool forms back up.  If your symptoms do not get better within 1 week or if they get worse, check with your doctor. Dulcolax (bisacodyl ) - Pick up over-the-counter and take as directed by the product packaging as needed to assist with the movement of your bowels.  Take with a full glass of water .  Use this product as needed if not relieved by Colace only.  MiraLax  (polyethylene glycol) - Pick up over-the-counter to have on hand.  MiraLax  is a solution that will increase the amount of water  in your bowels to assist with bowel movements.  Take as directed and can mix with a glass of water , juice, soda, coffee, or tea.  Take if you go more than two days without a movement.Do not use MiraLax  more than once per day. Call your doctor if you are still constipated or irregular after using this medication for 7 days in a row.  If you continue to have problems with postoperative constipation, please contact the office for further assistance and recommendations.  If you experience "the worst abdominal pain ever" or develop nausea or vomiting, please contact the office immediatly for further recommendations for treatment.  ITCHING  If you experience itching with your medications, try taking only a single pain pill, or even half a pain pill at a time.  You can also use Benadryl  over the counter for itching or also to help with sleep.   MEDICATIONS See your medication summary on the "After Visit Summary" that the nursing staff will review with you prior to discharge.  You may have some home  medications which will be placed on hold until you complete the course of blood thinner medication.  It is important for you to complete the blood thinner medication as prescribed by your surgeon.  Continue your approved medications as instructed at time of discharge.  PRECAUTIONS If you experience chest pain or shortness of breath - call 911 immediately for transfer to the hospital emergency department.  If you develop a fever greater that 101 F, purulent drainage from wound, increased redness or drainage from wound, foul odor from the wound/dressing, or calf pain - CONTACT YOUR SURGEON.                                                   FOLLOW-UP APPOINTMENTS Make sure you keep all of your appointments after your operation with your surgeon and caregivers. You should call the office at the above phone number and make an appointment for approximately two weeks after the date of your surgery or on the date  instructed by your surgeon outlined in the "After Visit Summary".  RANGE OF MOTION AND STRENGTHENING EXERCISES  These exercises are designed to help you keep full movement of your hip joint. Follow your caregiver's or physical therapist's instructions. Perform all exercises about fifteen times, three times per day or as directed. Exercise both hips, even if you have had only one joint replacement. These exercises can be done on a training (exercise) mat, on the floor, on a table or on a bed. Use whatever works the best and is most comfortable for you. Use music or television while you are exercising so that the exercises are a pleasant break in your day. This will make your life better with the exercises acting as a break in routine you can look forward to.  Lying on your back, slowly slide your foot toward your buttocks, raising your knee up off the floor. Then slowly slide your foot back down until your leg is straight again.  Lying on your back spread your legs as far apart as you can without causing  discomfort.  Lying on your side, raise your upper leg and foot straight up from the floor as far as is comfortable. Slowly lower the leg and repeat.  Lying on your back, tighten up the muscle in the front of your thigh (quadriceps muscles). You can do this by keeping your leg straight and trying to raise your heel off the floor. This helps strengthen the largest muscle supporting your knee.  Lying on your back, tighten up the muscles of your buttocks both with the legs straight and with the knee bent at a comfortable angle while keeping your heel on the floor.   POST-OPERATIVE OPIOID TAPER INSTRUCTIONS: It is important to wean off of your opioid medication as soon as possible. If you do not need pain medication after your surgery it is ok to stop day one. Opioids include: Codeine, Hydrocodone (Norco, Vicodin), Oxycodone (Percocet, oxycontin ) and hydromorphone  amongst others.  Long term and even short term use of opiods can cause: Increased pain response Dependence Constipation Depression Respiratory depression And more.  Withdrawal symptoms can include Flu like symptoms Nausea, vomiting And more Techniques to manage these symptoms Hydrate well Eat regular healthy meals Stay active Use relaxation techniques(deep breathing, meditating, yoga) Do Not substitute Alcohol to help with tapering If you have been on opioids for less than two weeks and do not have pain than it is ok to stop all together.  Plan to wean off of opioids This plan should start within one week post op of your joint replacement. Maintain the same interval or time between taking each dose and first decrease the dose.  Cut the total daily intake of opioids by one tablet each day Next start to increase the time between doses. The last dose that should be eliminated is the evening dose.   IF YOU ARE TRANSFERRED TO A SKILLED REHAB FACILITY If the patient is transferred to a skilled rehab facility following release from the  hospital, a list of the current medications will be sent to the facility for the patient to continue.  When discharged from the skilled rehab facility, please have the facility set up the patient's Home Health Physical Therapy prior to being released. Also, the skilled facility will be responsible for providing the patient with their medications at time of release from the facility to include their pain medication, the muscle relaxants, and their blood thinner medication. If the patient is still at the rehab facility at  time of the two week follow up appointment, the skilled rehab facility will also need to assist the patient in arranging follow up appointment in our office and any transportation needs.  MAKE SURE YOU:  Understand these instructions.  Get help right away if you are not doing well or get worse.    DENTAL ANTIBIOTICS:  In most cases prophylactic antibiotics for Dental procdeures after total joint surgery are not necessary.  Exceptions are as follows:  1. History of prior total joint infection  2. Severely immunocompromised (Organ Transplant, cancer chemotherapy, Rheumatoid biologic meds such as Humera)  3. Poorly controlled diabetes (A1C &gt; 8.0, blood glucose over 200)  If you have one of these conditions, contact your surgeon for an antibiotic prescription, prior to your dental procedure.    Pick up stool softner and laxative for home use following surgery while on pain medications. Do not submerge incision under water . Please use good hand washing techniques while changing dressing each day. May shower starting three days after surgery. Please use a clean towel to pat the incision dry following showers. Continue to use ice for pain and swelling after surgery. Do not use any lotions or creams on the incision until instructed by your surgeon.    Information on my medicine - XARELTO  (Rivaroxaban )    Why was Xarelto  prescribed for you? Xarelto  was prescribed  for you to reduce the risk of blood clots forming after orthopedic surgery. The medical term for these abnormal blood clots is venous thromboembolism (VTE).  What do you need to know about xarelto  ? Take your Xarelto  ONCE DAILY at the same time every day. You may take it either with or without food.  If you have difficulty swallowing the tablet whole, you may crush it and mix in applesauce just prior to taking your dose.  Take Xarelto  exactly as prescribed by your doctor and DO NOT stop taking Xarelto  without talking to the doctor who prescribed the medication.  Stopping without other VTE prevention medication to take the place of Xarelto  may increase your risk of developing a clot.  After discharge, you should have regular check-up appointments with your healthcare provider that is prescribing your Xarelto .    What do you do if you miss a dose? If you miss a dose, take it as soon as you remember on the same day then continue your regularly scheduled once daily regimen the next day. Do not take two doses of Xarelto  on the same day.   Important Safety Information A possible side effect of Xarelto  is bleeding. You should call your healthcare provider right away if you experience any of the following: Bleeding from an injury or your nose that does not stop. Unusual colored urine (red or dark brown) or unusual colored stools (red or black). Unusual bruising for unknown reasons. A serious fall or if you hit your head (even if there is no bleeding).  Some medicines may interact with Xarelto  and might increase your risk of bleeding while on Xarelto . To help avoid this, consult your healthcare provider or pharmacist prior to using any new prescription or non-prescription medications, including herbals, vitamins, non-steroidal anti-inflammatory drugs (NSAIDs) and supplements.  This website has more information on Xarelto : www.xarelto .com.

## 2024-05-10 NOTE — Anesthesia Procedure Notes (Signed)
 Procedure Name: Intubation Date/Time: 05/10/2024 3:03 PM  Performed by: Obadiah Reyes BROCKS, CRNAPre-anesthesia Checklist: Patient identified, Emergency Drugs available, Suction available and Patient being monitored Patient Re-evaluated:Patient Re-evaluated prior to induction Oxygen Delivery Method: Circle System Utilized Preoxygenation: Pre-oxygenation with 100% oxygen Induction Type: IV induction Ventilation: Mask ventilation without difficulty Laryngoscope Size: Miller and 2 Grade View: Grade II Tube type: Oral Tube size: 7.0 mm Number of attempts: 1 Airway Equipment and Method: Stylet and Oral airway Placement Confirmation: ETT inserted through vocal cords under direct vision, positive ETCO2 and breath sounds checked- equal and bilateral Secured at: 23 cm Tube secured with: Tape Dental Injury: Teeth and Oropharynx as per pre-operative assessment

## 2024-05-11 ENCOUNTER — Other Ambulatory Visit (HOSPITAL_COMMUNITY): Payer: Self-pay

## 2024-05-11 ENCOUNTER — Telehealth (HOSPITAL_COMMUNITY): Payer: Self-pay | Admitting: Pharmacy Technician

## 2024-05-11 ENCOUNTER — Encounter (HOSPITAL_COMMUNITY): Payer: Self-pay | Admitting: Orthopedic Surgery

## 2024-05-11 DIAGNOSIS — M1611 Unilateral primary osteoarthritis, right hip: Secondary | ICD-10-CM | POA: Diagnosis not present

## 2024-05-11 LAB — BASIC METABOLIC PANEL WITH GFR
Anion gap: 8 (ref 5–15)
BUN: 13 mg/dL (ref 8–23)
CO2: 24 mmol/L (ref 22–32)
Calcium: 8.7 mg/dL — ABNORMAL LOW (ref 8.9–10.3)
Chloride: 104 mmol/L (ref 98–111)
Creatinine, Ser: 0.79 mg/dL (ref 0.61–1.24)
GFR, Estimated: >60 mL/min (ref >60)
Glucose, Bld: 135 mg/dL — ABNORMAL HIGH (ref 70–99)
Potassium: 4.2 mmol/L (ref 3.5–5.1)
Sodium: 136 mmol/L (ref 135–145)

## 2024-05-11 LAB — CBC
HCT: 43 % (ref 39.0–52.0)
Hemoglobin: 14.1 g/dL (ref 13.0–17.0)
MCH: 30 pg (ref 26.0–34.0)
MCHC: 32.8 g/dL (ref 30.0–36.0)
MCV: 91.5 fL (ref 80.0–100.0)
Platelets: 348 K/uL (ref 150–400)
RBC: 4.7 MIL/uL (ref 4.22–5.81)
RDW: 13.3 % (ref 11.5–15.5)
WBC: 17 K/uL — ABNORMAL HIGH (ref 4.0–10.5)
nRBC: 0 % (ref 0.0–0.2)

## 2024-05-11 MED ORDER — TRAMADOL HCL 50 MG PO TABS
50.0000 mg | ORAL_TABLET | Freq: Four times a day (QID) | ORAL | 0 refills | Status: AC | PRN
  Filled 2024-05-11: qty 40, 7d supply, fill #0

## 2024-05-11 MED ORDER — METHOCARBAMOL 500 MG PO TABS
500.0000 mg | ORAL_TABLET | Freq: Four times a day (QID) | ORAL | 0 refills | Status: AC | PRN
  Filled 2024-05-11: qty 40, 10d supply, fill #0

## 2024-05-11 MED ORDER — RIVAROXABAN 10 MG PO TABS
10.0000 mg | ORAL_TABLET | Freq: Every day | ORAL | 0 refills | Status: AC
  Filled 2024-05-11: qty 20, 20d supply, fill #0

## 2024-05-11 MED ORDER — ONDANSETRON HCL 4 MG PO TABS
4.0000 mg | ORAL_TABLET | Freq: Four times a day (QID) | ORAL | 0 refills | Status: AC | PRN
  Filled 2024-05-11: qty 20, 5d supply, fill #0

## 2024-05-11 MED ORDER — HYDROCODONE-ACETAMINOPHEN 5-325 MG PO TABS
1.0000 | ORAL_TABLET | Freq: Four times a day (QID) | ORAL | 0 refills | Status: AC | PRN
  Filled 2024-05-11: qty 42, 7d supply, fill #0

## 2024-05-11 NOTE — Plan of Care (Signed)

## 2024-05-11 NOTE — Progress Notes (Signed)
 Physical Therapy Treatment Patient Details Name: Christian Douglas MRN: [id number] DOB: Aug 19, 1961 Today's Date: 05/11/2024   History of Present Illness Pt s/p R THR and with hx of BPH and bladder CA    PT Comments  Pt motivated and progressing well with mobility.  Pt up to ambulate in hall, negotiated stairs, up to bathroom for toileting with hand hygiene standing at sink, reviewed car transfers and reviewed dressing techniques with pt fully dressed at session end.  Pt eager for dc home this date.    If plan is discharge home, recommend the following: A little help with walking and/or transfers;A little help with bathing/dressing/bathroom;Assistance with cooking/housework;Assist for transportation;Help with stairs or ramp for entrance   Can travel by private vehicle        Equipment Recommendations  None recommended by PT    Recommendations for Other Services       Precautions / Restrictions Precautions Precautions: Fall Restrictions Weight Bearing Restrictions Per Provider Order: Yes RLE Weight Bearing Per Provider Order: Weight bearing as tolerated     Mobility  Bed Mobility Overal bed mobility: Needs Assistance Bed Mobility: Supine to Sit     Supine to sit: Contact guard     General bed mobility comments: Pt up in chair and returns to same    Transfers Overall transfer level: Needs assistance Equipment used: Rolling walker (2 wheels) Transfers: Sit to/from Stand Sit to Stand: Contact guard assist, Supervision           General transfer comment: cues for LE management and use of UEs to self assist    Ambulation/Gait Ambulation/Gait assistance: Supervision Gait Distance (Feet): 130 Feet (and 15' twice to/from bathroom) Assistive device: Rolling walker (2 wheels) Gait Pattern/deviations: Decreased step length - right, Decreased step length - left, Shuffle, Trunk flexed, Step-to pattern, Step-through pattern Gait velocity: decr     General Gait Details: cues  for posture, position from RW and initial sequence   Stairs Stairs: Yes Stairs assistance: Contact guard assist Stair Management: One rail Right, Step to pattern, Forwards, With cane Number of Stairs: 3 General stair comments: Pt self cues for sequence   Wheelchair Mobility     Tilt Bed    Modified Rankin (Stroke Patients Only)       Balance Overall balance assessment: Mild deficits observed, not formally tested                                          Communication Communication Communication: No apparent difficulties  Cognition Arousal: Alert Behavior During Therapy: WFL for tasks assessed/performed   PT - Cognitive impairments: No apparent impairments                         Following commands: Intact      Cueing Cueing Techniques: Verbal cues  Exercises Total Joint Exercises Ankle Circles/Pumps: AROM, Both, 15 reps, Supine Quad Sets: AROM, Both, Supine, 5 reps Heel Slides: 5 reps, AAROM, Right, Supine Hip ABduction/ADduction: AAROM, Right, Supine, 5 reps Long Arc Quad: AAROM, Right, Seated, 5 reps    General Comments        Pertinent Vitals/Pain Pain Assessment Pain Assessment: 0-10 Pain Score: 4  Pain Location: R hip Pain Descriptors / Indicators: Aching, Sore, Tightness Pain Intervention(s): Limited activity within patient's tolerance, Monitored during session, Premedicated before session, Ice applied    Home Living  Family/patient expects to be discharged to:: Private residence Living Arrangements: Alone Available Help at Discharge: Family;Available 24 hours/day Type of Home: House Home Access: Stairs to enter Entrance Stairs-Rails: Right     Home Layout: One level Home Equipment: Agricultural consultant (2 wheels);Cane - single point;Toilet riser      Prior Function            PT Goals (current goals can now be found in the care plan section) Acute Rehab PT Goals Patient Stated Goal: Regain IND PT Goal  Formulation: With patient Time For Goal Achievement: 05/18/24 Potential to Achieve Goals: Good Progress towards PT goals: Progressing toward goals    Frequency    7X/week      PT Plan      Co-evaluation              AM-PAC PT 6 Clicks Mobility   Outcome Measure  Help needed turning from your back to your side while in a flat bed without using bedrails?: A Little Help needed moving from lying on your back to sitting on the side of a flat bed without using bedrails?: A Little Help needed moving to and from a bed to a chair (including a wheelchair)?: A Little Help needed standing up from a chair using your arms (e.g., wheelchair or bedside chair)?: A Little Help needed to walk in hospital room?: A Little Help needed climbing 3-5 steps with a railing? : A Little 6 Click Score: 18    End of Session Equipment Utilized During Treatment: Gait belt Activity Tolerance: Patient tolerated treatment well Patient left: in chair;with call bell/phone within reach;with chair alarm set Nurse Communication: Mobility status PT Visit Diagnosis: Difficulty in walking, not elsewhere classified (R26.2)     Time: 8568-8540 PT Time Calculation (min) (ACUTE ONLY): 28 min  Charges:    $Gait Training: 8-22 mins $Therapeutic Exercise: 8-22 mins $Therapeutic Activity: 8-22 mins PT General Charges $$ ACUTE PT VISIT: 1 Visit                     [facility] PT Acute Rehabilitation Services Office [phone]    Taleya Whitcher 05/11/2024, 4:02 PM

## 2024-05-11 NOTE — Plan of Care (Signed)
  Problem: Education: Goal: Knowledge of General Education information will improve Description: Including pain rating scale, medication(s)/side effects and non-pharmacologic comfort measures 05/11/2024 1028 by Alaina Dozier PARAS, RN Outcome: Adequate for Discharge 05/11/2024 0808 by Alaina Dozier PARAS, RN Outcome: Progressing   Problem: Health Behavior/Discharge Planning: Goal: Ability to manage health-related needs will improve 05/11/2024 1028 by Alaina Dozier PARAS, RN Outcome: Adequate for Discharge 05/11/2024 0808 by Alaina Dozier PARAS, RN Outcome: Progressing   Problem: Clinical Measurements: Goal: Ability to maintain clinical measurements within normal limits will improve 05/11/2024 1028 by Alaina Dozier PARAS, RN Outcome: Adequate for Discharge 05/11/2024 0808 by Alaina Dozier PARAS, RN Outcome: Progressing Goal: Will remain free from infection 05/11/2024 1028 by Alaina Dozier PARAS, RN Outcome: Adequate for Discharge 05/11/2024 0808 by Alaina Dozier PARAS, RN Outcome: Progressing Goal: Diagnostic test results will improve 05/11/2024 1028 by Alaina Dozier PARAS, RN Outcome: Adequate for Discharge 05/11/2024 0808 by Alaina Dozier PARAS, RN Outcome: Progressing Goal: Respiratory complications will improve Outcome: Adequate for Discharge Goal: Cardiovascular complication will be avoided Outcome: Adequate for Discharge   Problem: Activity: Goal: Risk for activity intolerance will decrease Outcome: Adequate for Discharge   Problem: Nutrition: Goal: Adequate nutrition will be maintained Outcome: Adequate for Discharge   Problem: Coping: Goal: Level of anxiety will decrease Outcome: Adequate for Discharge   Problem: Elimination: Goal: Will not experience complications related to bowel motility Outcome: Adequate for Discharge Goal: Will not experience complications related to urinary retention Outcome: Adequate for Discharge   Problem: Pain  Managment: Goal: General experience of comfort will improve and/or be controlled Outcome: Adequate for Discharge   Problem: Safety: Goal: Ability to remain free from injury will improve Outcome: Adequate for Discharge   Problem: Skin Integrity: Goal: Risk for impaired skin integrity will decrease Outcome: Adequate for Discharge   Problem: Education: Goal: Knowledge of the prescribed therapeutic regimen will improve Outcome: Adequate for Discharge Goal: Understanding of discharge needs will improve Outcome: Adequate for Discharge Goal: Individualized Educational Video(s) Outcome: Adequate for Discharge   Problem: Activity: Goal: Ability to avoid complications of mobility impairment will improve Outcome: Adequate for Discharge Goal: Ability to tolerate increased activity will improve Outcome: Adequate for Discharge   Problem: Clinical Measurements: Goal: Postoperative complications will be avoided or minimized Outcome: Adequate for Discharge   Problem: Pain Management: Goal: Pain level will decrease with appropriate interventions Outcome: Adequate for Discharge   Problem: Skin Integrity: Goal: Will show signs of wound healing Outcome: Adequate for Discharge

## 2024-05-11 NOTE — Telephone Encounter (Signed)
 Pharmacy Patient Advocate Encounter   Received notification from Inpatient Request that prior authorization for traMADol  HCl 50MG  tablets  is required/requested.   Insurance verification completed.   The patient is insured through [facility] .   Per test claim: PA required; PA submitted to above mentioned insurance via Latent Key/confirmation #/EOC [id number] Status is pending

## 2024-05-11 NOTE — TOC Transition Note (Signed)
 Transition of Care [facility]) - Discharge Note   Patient Details  Name: Christian Douglas MRN: [id number] Date of Birth: 07-30-1961  [facility]) CM/SW Contact:  Alfonse JONELLE Rex, RN Phone Number: 05/11/2024, 9:29 AM   Clinical Narrative:  Met with patient at bedside to review dc therapy and home equipment needs, pt confirmed HEP, has RW. No TOC needs       Final next level of care: Home/Self Care Barriers to Discharge: No Barriers Identified   Patient Goals and CMS Choice Patient states their goals for this hospitalization and ongoing recovery are:: return home          Discharge Placement                       Discharge Plan and Services Additional resources added to the After Visit Summary for                                       Social Drivers of Health (SDOH) Interventions SDOH Screenings   Food Insecurity: No Food Insecurity (05/10/2024)  Housing: Low Risk  (05/10/2024)  Transportation Needs: No Transportation Needs (05/10/2024)  Utilities: Not At Risk (05/10/2024)  Tobacco Use: Low Risk  (05/10/2024)     Readmission Risk Interventions     No data to display

## 2024-05-11 NOTE — Progress Notes (Signed)
   Subjective: 1 Day Post-Op Procedure(s) (LRB): ARTHROPLASTY, HIP, TOTAL, ANTERIOR APPROACH (Right) Patient reports pain as mild.   Patient seen in rounds by Dr. Melodi. Patient is well, and has had no acute complaints or problems. Denies chest pain or SOB. No issues overnight. Voiding without difficulty We will begin therapy today  Objective: Vital signs in last 24 hours: Temp:  [97.6 F (36.4 C)-98.4 F (36.9 C)] 97.6 F (36.4 C) (08/21 0730) Pulse Rate:  [65-88] 65 (08/21 0730) Resp:  [10-18] 18 (08/21 0730) BP: (116-154)/(64-82) 128/80 (08/21 0730) SpO2:  [94 %-100 %] 98 % (08/21 0730) Weight:  [882 kg] 117 kg (08/20 1057)  Intake/Output from previous day:  Intake/Output Summary (Last 24 hours) at 05/11/2024 0731 Last data filed at 05/11/2024 0600 Gross per 24 hour  Intake 3444.75 ml  Output 1650 ml  Net 1794.75 ml     Intake/Output this shift: No intake/output data recorded.  Labs: Recent Labs    05/11/24 0349  HGB 14.1   Recent Labs    05/11/24 0349  WBC 17.0*  RBC 4.70  HCT 43.0  PLT 348   Recent Labs    05/11/24 0349  NA 136  K 4.2  CL 104  CO2 24  BUN 13  CREATININE 0.79  GLUCOSE 135*  CALCIUM 8.7*   No results for input(s): LABPT, INR in the last 72 hours.  Exam: General - Patient is Alert and Oriented Extremity - Neurologically intact Neurovascular intact Sensation intact distally Dorsiflexion/Plantar flexion intact Dressing - dressing C/D/I Motor Function - intact, moving foot and toes well on exam.   Past Medical History:  Diagnosis Date   Arthritis    Benign prostatic hypertrophy    Bladder cancer (HCC) 09/21/2005   ED (erectile dysfunction)    GERD (gastroesophageal reflux disease)    Hypertension    Melanoma (HCC)    right posterior shoulder   Sleep apnea    no cpap    Assessment/Plan: 1 Day Post-Op Procedure(s) (LRB): ARTHROPLASTY, HIP, TOTAL, ANTERIOR APPROACH (Right) Principal Problem:   OA  (osteoarthritis) of hip Active Problems:   Primary osteoarthritis of right hip  Estimated body mass index is 34.99 kg/m as calculated from the following:   Height as of this encounter: 6' (1.829 m).   Weight as of this encounter: 117 kg. Advance diet Up with therapy D/C IV fluids  DVT Prophylaxis - Xarelto  Weight bearing as tolerated. Begin therapy.  Plan is to go Home after hospital stay. Plan for discharge with HEP later today if progresses with therapy and meeting goals. Follow-up in the office in 2 weeks.  The PDMP database was reviewed today prior to any opioid medications being prescribed to this patient.  Roxie Mess, PA-C Orthopedic Surgery [phone] 05/11/2024, 7:31 AM

## 2024-05-11 NOTE — Progress Notes (Signed)
 Physical Therapy Treatment Patient Details Name: Christian Douglas MRN: [id number] DOB: August 17, 1961 Today's Date: 05/11/2024   History of Present Illness Pt s/p R THR and with hx of BPH and bladder CA    PT Comments  Pt reviewed HEP including progression next week.  Written instruction provided.  Stair training deferred at pt request on arrival of lunch tray.   If plan is discharge home, recommend the following: A little help with walking and/or transfers;A little help with bathing/dressing/bathroom;Assistance with cooking/housework;Assist for transportation;Help with stairs or ramp for entrance   Can travel by private vehicle        Equipment Recommendations  None recommended by PT    Recommendations for Other Services       Precautions / Restrictions Precautions Precautions: Fall Restrictions Weight Bearing Restrictions Per Provider Order: Yes RLE Weight Bearing Per Provider Order: Weight bearing as tolerated     Mobility  Bed Mobility Overal bed mobility: Needs Assistance Bed Mobility: Supine to Sit     Supine to sit: Contact guard     General bed mobility comments: Pt up in chair    Transfers Overall transfer level: Needs assistance Equipment used: Rolling walker (2 wheels) Transfers: Sit to/from Stand Sit to Stand: Contact guard assist           General transfer comment: cues for LE management and use of UEs to self assist    Ambulation/Gait Ambulation/Gait assistance: Min assist, Contact guard assist Gait Distance (Feet):  (and 15' twice to/from bathroom) Assistive device: Rolling walker (2 wheels) Gait Pattern/deviations: Step-to pattern, Decreased step length - right, Decreased step length - left, Shuffle, Trunk flexed Gait velocity: decr     General Gait Details: cues for posture, position from RW and initial sequence   Stairs             Wheelchair Mobility     Tilt Bed    Modified Rankin (Stroke Patients Only)       Balance  Overall balance assessment: Mild deficits observed, not formally tested                                          Communication Communication Communication: No apparent difficulties  Cognition Arousal: Alert Behavior During Therapy: WFL for tasks assessed/performed   PT - Cognitive impairments: No apparent impairments                         Following commands: Intact      Cueing Cueing Techniques: Verbal cues  Exercises Total Joint Exercises Ankle Circles/Pumps: AROM, Both, 15 reps, Supine Quad Sets: AROM, Both, Supine, 5 reps Heel Slides: 5 reps, AAROM, Right, Supine Hip ABduction/ADduction: AAROM, Right, Supine, 5 reps Long Arc Quad: AAROM, Right, Seated, 5 reps    General Comments        Pertinent Vitals/Pain Pain Assessment Pain Assessment: 0-10 Pain Score: 4  Pain Location: R hip Pain Descriptors / Indicators: Aching, Sore, Tightness Pain Intervention(s): Limited activity within patient's tolerance, Monitored during session, Premedicated before session, Ice applied    Home Living Family/patient expects to be discharged to:: Private residence Living Arrangements: Alone Available Help at Discharge: Family;Available 24 hours/day Type of Home: House Home Access: Stairs to enter Entrance Stairs-Rails: Right     Home Layout: One level Home Equipment: Agricultural consultant (2 wheels);Cane - single point;Toilet riser  Prior Function            PT Goals (current goals can now be found in the care plan section) Acute Rehab PT Goals Patient Stated Goal: Regain IND PT Goal Formulation: With patient Time For Goal Achievement: 05/18/24 Potential to Achieve Goals: Good Progress towards PT goals: Progressing toward goals    Frequency    7X/week      PT Plan      Co-evaluation              AM-PAC PT 6 Clicks Mobility   Outcome Measure  Help needed turning from your back to your side while in a flat bed without using  bedrails?: A Little Help needed moving from lying on your back to sitting on the side of a flat bed without using bedrails?: A Little Help needed moving to and from a bed to a chair (including a wheelchair)?: A Little Help needed standing up from a chair using your arms (e.g., wheelchair or bedside chair)?: A Little Help needed to walk in hospital room?: A Little Help needed climbing 3-5 steps with a railing? : A Little 6 Click Score: 18    End of Session Equipment Utilized During Treatment: Gait belt Activity Tolerance: Patient tolerated treatment well Patient left: in chair;with call bell/phone within reach;with chair alarm set Nurse Communication: Mobility status PT Visit Diagnosis: Difficulty in walking, not elsewhere classified (R26.2)     Time: 8653-8597 PT Time Calculation (min) (ACUTE ONLY): 16 min  Charges:    $Gait Training: 8-22 mins $Therapeutic Exercise: 8-22 mins PT General Charges $$ ACUTE PT VISIT: 1 Visit                     [facility] PT Acute Rehabilitation Services Office [phone]    [facility] 05/11/2024, 3:53 PM

## 2024-05-11 NOTE — Evaluation (Signed)
 Physical Therapy Evaluation Patient Details Name: Christian Douglas MRN: [id number] DOB: [birth year] Today's Date: 05/11/2024  History of Present Illness  Pt s/p R THR and with hx of BPH and bladder CA  Clinical Impression  Pt s/p R THR and presents with decreased R LE strength/ROM and post op pain limiting functional mobility.  Pt should progress to dc home with family assist.        If plan is discharge home, recommend the following: A little help with walking and/or transfers;A little help with bathing/dressing/bathroom;Assistance with cooking/housework;Assist for transportation;Help with stairs or ramp for entrance   Can travel by private vehicle        Equipment Recommendations None recommended by PT  Recommendations for Other Services       Functional Status Assessment Patient has had a recent decline in their functional status and demonstrates the ability to make significant improvements in function in a reasonable and predictable amount of time.     Precautions / Restrictions Precautions Precautions: Fall Restrictions Weight Bearing Restrictions Per Provider Order: Yes RLE Weight Bearing Per Provider Order: Weight bearing as tolerated      Mobility  Bed Mobility Overal bed mobility: Needs Assistance Bed Mobility: Supine to Sit     Supine to sit: Contact guard     General bed mobility comments: cues for sequence and use of L LE to self assist.  Pt self assisting R LE with gait belt    Transfers Overall transfer level: Needs assistance Equipment used: Rolling walker (2 wheels) Transfers: Sit to/from Stand Sit to Stand: Contact guard assist           General transfer comment: cues for LE management and use of UEs to self assist    Ambulation/Gait Ambulation/Gait assistance: Min assist, Contact guard assist Gait Distance (Feet): 120 Feet (and 15' twice to/from bathroom) Assistive device: Rolling walker (2 wheels) Gait Pattern/deviations: Step-to pattern,  Decreased step length - right, Decreased step length - left, Shuffle, Trunk flexed Gait velocity: decr     General Gait Details: cues for posture, position from RW and initial sequence  Stairs            Wheelchair Mobility     Tilt Bed    Modified Rankin (Stroke Patients Only)       Balance Overall balance assessment: Mild deficits observed, not formally tested                                           Pertinent Vitals/Pain Pain Assessment Pain Assessment: 0-10 Pain Score: 4  Pain Location: R hip Pain Descriptors / Indicators: Aching, Sore Pain Intervention(s): Limited activity within patient's tolerance, Monitored during session, Premedicated before session, Ice applied    Home Living Family/patient expects to be discharged to:: Private residence Living Arrangements: Alone Available Help at Discharge: Family;Available 24 hours/day Type of Home: House Home Access: Stairs to enter Entrance Stairs-Rails: Right     Home Layout: One level Home Equipment: Agricultural consultant (2 wheels);Cane - single point;Toilet riser      Prior Function Prior Level of Function : Independent/Modified Independent                     Extremity/Trunk Assessment   Upper Extremity Assessment Upper Extremity Assessment: Overall WFL for tasks assessed    Lower Extremity Assessment Lower Extremity Assessment: RLE deficits/detail RLE Deficits /  Details: 2/5 strength at hip with AAROM at hip to 80 flex and 15 abd    Cervical / Trunk Assessment Cervical / Trunk Assessment: Normal  Communication   Communication Communication: No apparent difficulties    Cognition Arousal: Alert Behavior During Therapy: WFL for tasks assessed/performed   PT - Cognitive impairments: No apparent impairments                         Following commands: Intact       Cueing Cueing Techniques: Verbal cues     General Comments      Exercises Total Joint  Exercises Ankle Circles/Pumps: AROM, Both, 15 reps, Supine Quad Sets: AROM, Both, 10 reps, Supine Heel Slides: AAROM, Right, 20 reps, Supine Hip ABduction/ADduction: AAROM, Right, 15 reps, Supine Long Arc Quad: AAROM, Right, 10 reps, Seated   Assessment/Plan    PT Assessment Patient needs continued PT services  PT Problem List Decreased strength;Decreased range of motion;Decreased activity tolerance;Decreased balance;Decreased mobility;Decreased knowledge of use of DME;Pain       PT Treatment Interventions DME instruction;Gait training;Stair training;Functional mobility training;Therapeutic activities;Therapeutic exercise;Patient/family education    PT Goals (Current goals can be found in the Care Plan section)  Acute Rehab PT Goals Patient Stated Goal: Regain IND PT Goal Formulation: With patient Time For Goal Achievement: 05/18/24 Potential to Achieve Goals: Good    Frequency 7X/week     Co-evaluation               AM-PAC PT 6 Clicks Mobility  Outcome Measure Help needed turning from your back to your side while in a flat bed without using bedrails?: A Little Help needed moving from lying on your back to sitting on the side of a flat bed without using bedrails?: A Little Help needed moving to and from a bed to a chair (including a wheelchair)?: A Little Help needed standing up from a chair using your arms (e.g., wheelchair or bedside chair)?: A Little Help needed to walk in hospital room?: A Little Help needed climbing 3-5 steps with a railing? : A Little 6 Click Score: 18    End of Session Equipment Utilized During Treatment: Gait belt Activity Tolerance: Patient tolerated treatment well Patient left: in chair;with call bell/phone within reach;with chair alarm set Nurse Communication: Mobility status PT Visit Diagnosis: Difficulty in walking, not elsewhere classified (R26.2)    Time: 9042-8962 PT Time Calculation (min) (ACUTE ONLY): 40 min   Charges:   PT  Evaluation $PT Eval Low Complexity: 1 Low PT Treatments $Gait Training: 8-22 mins $Therapeutic Exercise: 8-22 mins PT General Charges $$ ACUTE PT VISIT: 1 Visit         [facility] PT Acute Rehabilitation Services Office [phone]   [facility] 05/11/2024, 3:43 PM

## 2024-05-11 NOTE — Telephone Encounter (Signed)
 Patient Product/process development scientist completed.    The patient is insured through [facility]. Patient has [employer], may use a copay card, and/or apply for patient assistance if available.    Ran test claim for Xarelto  20 mg and the current 30 day co-pay is $18.00.   This test claim was processed through [facility]- copay amounts may vary at other pharmacies due to pharmacy/plan contracts, or as the patient moves through the different stages of their insurance plan.     Reyes Sharps, CPHT Pharmacy Technician III Certified Patient Advocate [facility] Pharmacy Patient Advocate Team Direct Number: [phone]  Fax: [phone]

## 2024-05-11 NOTE — Telephone Encounter (Signed)
 Pharmacy Patient Advocate Encounter  Received notification from [facility] that Prior Authorization for traMADol  HCl 50MG  tablets  has been APPROVED from 05/11/2024 to 06/11/2024   PA #/Case ID/Reference #: [id number]

## 2024-05-19 NOTE — Discharge Summary (Signed)
 Patient ID: Christian Douglas MRN: [id number] DOB/AGE: [id number] 64 y.o.  Admit date: 05/10/2024 Discharge date: 05/11/2024  Admission Diagnoses:  Principal Problem:   OA (osteoarthritis) of hip Active Problems:   Primary osteoarthritis of right hip   Discharge Diagnoses:  Same  Past Medical History:  Diagnosis Date   Arthritis    Benign prostatic hypertrophy    Bladder cancer (HCC) 09/21/2005   ED (erectile dysfunction)    GERD (gastroesophageal reflux disease)    Hypertension    Melanoma (HCC)    right posterior shoulder   Sleep apnea    no cpap    Surgeries: Procedure(s): ARTHROPLASTY, HIP, TOTAL, ANTERIOR APPROACH on 05/10/2024   Consultants:   Discharged Condition: Improved  Hospital Course: Christian Douglas is an 63 y.o. male who was admitted 05/10/2024 for operative treatment ofOA (osteoarthritis) of hip. Patient has severe unremitting pain that affects sleep, daily activities, and work/hobbies. After pre-op clearance the patient was taken to the operating room on 05/10/2024 and underwent  Procedure(s): ARTHROPLASTY, HIP, TOTAL, ANTERIOR APPROACH.    Patient was given perioperative antibiotics:  Anti-infectives (From admission, onward)    Start     Dose/Rate Route Frequency Ordered Stop   05/10/24 2100  ceFAZolin  (ANCEF ) IVPB 2g/100 mL premix        2 g 200 mL/hr over 30 Minutes Intravenous Every 6 hours 05/10/24 1757 05/11/24 0807   05/10/24 1115  ceFAZolin  (ANCEF ) IVPB 2g/100 mL premix        2 g 200 mL/hr over 30 Minutes Intravenous On call to O.R. 05/10/24 1100 05/10/24 1504        Patient was given sequential compression devices, early ambulation, and chemoprophylaxis to prevent DVT.  Patient benefited maximally from hospital stay and there were no complications.    Recent vital signs: No data found.   Recent laboratory studies: No results for input(s): WBC, HGB, HCT, PLT, NA, K, CL, CO2, BUN, CREATININE, GLUCOSE, INR, CALCIUM in  the last 72 hours.  Invalid input(s): PT, 2   Discharge Medications:   Allergies as of 05/11/2024       Reactions   Nasonex [mometasone Furoate] Other (See Comments)   photosensitivity         Medication List     STOP taking these medications    amoxicillin-clavulanate 875-125 MG tablet Commonly known as: AUGMENTIN   GARLIC PO   multivitamin with minerals Tabs tablet   ONE-A-DAY BONE STRENGTH PO   VITAMIN C PO   VITAMIN D3 PO   zinc sulfate (50mg  elemental zinc) 220 (50 Zn) MG capsule       TAKE these medications    docusate sodium  100 MG capsule Commonly known as: COLACE Take 100 mg by mouth at bedtime.   famotidine  20 MG tablet Commonly known as: PEPCID  Take 20 mg by mouth See admin instructions. Take 1 tablet (20 mg) by mouth scheduled every morning, may take an additional dose (20 mg) by mouth at night if needed for indigestion/heartburn.   HYDROcodone -acetaminophen  5-325 MG tablet Commonly known as: NORCO/VICODIN Take 1-2 tablets by mouth every 6 (six) hours as needed for severe pain (pain score 7-10).   methocarbamol  500 MG tablet Commonly known as: ROBAXIN  Take 1 tablet (500 mg total) by mouth every 6 (six) hours as needed for muscle spasms.   ondansetron  4 MG tablet Commonly known as: ZOFRAN  Take 1 tablet (4 mg total) by mouth every 6 (six) hours as needed for nausea.   telmisartan  40 MG tablet  Commonly known as: MICARDIS  TAKE 1 TABLET BY MOUTH DAILY What changed: when to take this   traMADol  50 MG tablet Commonly known as: ULTRAM  Take 1-2 tablets (50-100 mg total) by mouth every 6 (six) hours as needed for moderate pain (pain score 4-6).   Xarelto  10 MG Tabs tablet Generic drug: rivaroxaban  Take 1 tablet (10 mg total) by mouth daily with breakfast for 20 days. Then take one 81 mg aspirin once a day for three weeks. Then discontinue aspirin.               Discharge Care Instructions  (From admission, onward)            Start     Ordered   05/11/24 0000  Weight bearing as tolerated        05/11/24 0733   05/11/24 0000  Change dressing       Comments: You have an adhesive waterproof bandage over the incision. Leave this in place until your first follow-up appointment. Once you remove this you will not need to place another bandage.   05/11/24 0733            Diagnostic Studies: DG Pelvis Portable Result Date: 05/10/2024 CLINICAL DATA:  Postop. EXAM: PORTABLE PELVIS 1-2 VIEWS COMPARISON:  None Available. FINDINGS: Right hip arthroplasty in expected alignment. No periprosthetic lucency or fracture. Recent postsurgical change includes air and edema in the soft tissues. IMPRESSION: Right hip arthroplasty without immediate postoperative complication. Electronically Signed   By: Andrea Gasman M.D.   On: 05/10/2024 17:15   DG HIP UNILAT WITH PELVIS 1V RIGHT Result Date: 05/10/2024 CLINICAL DATA:  Elective surgery. EXAM: DG HIP (WITH OR WITHOUT PELVIS) 1V RIGHT COMPARISON:  None Available. FINDINGS: Three fluoroscopic spot views of the pelvis and right hip obtained in the operating room. Images during hip arthroplasty. Fluoroscopy time 6.2 seconds. Dose 1.1876 mGy. IMPRESSION: Intraoperative fluoroscopy during right hip arthroplasty. Electronically Signed   By: Andrea Gasman M.D.   On: 05/10/2024 17:14   DG C-Arm 1-60 Min-No Report Result Date: 05/10/2024 Fluoroscopy was utilized by the requesting physician.  No radiographic interpretation.   DG C-Arm 1-60 Min-No Report Result Date: 05/10/2024 Fluoroscopy was utilized by the requesting physician.  No radiographic interpretation.    Disposition: Discharge disposition: 01-Home or Self Care       Discharge Instructions     Call MD / Call 911   Complete by: As directed    If you experience chest pain or shortness of breath, CALL 911 and be transported to the hospital emergency room.  If you develope a fever above 101 F, pus (white drainage) or  increased drainage or redness at the wound, or calf pain, call your surgeon's office.   Change dressing   Complete by: As directed    You have an adhesive waterproof bandage over the incision. Leave this in place until your first follow-up appointment. Once you remove this you will not need to place another bandage.   Constipation Prevention   Complete by: As directed    Drink plenty of fluids.  Prune juice may be helpful.  You may use a stool softener, such as Colace (over the counter) 100 mg twice a day.  Use MiraLax  (over the counter) for constipation as needed.   Diet - low sodium heart healthy   Complete by: As directed    Do not sit on low chairs, stoools or toilet seats, as it may be difficult to get up from low  surfaces   Complete by: As directed    Driving restrictions   Complete by: As directed    No driving for two weeks   Post-operative opioid taper instructions:   Complete by: As directed    POST-OPERATIVE OPIOID TAPER INSTRUCTIONS: It is important to wean off of your opioid medication as soon as possible. If you do not need pain medication after your surgery it is ok to stop day one. Opioids include: Codeine, Hydrocodone (Norco, Vicodin), Oxycodone (Percocet, oxycontin ) and hydromorphone  amongst others.  Long term and even short term use of opiods can cause: Increased pain response Dependence Constipation Depression Respiratory depression And more.  Withdrawal symptoms can include Flu like symptoms Nausea, vomiting And more Techniques to manage these symptoms Hydrate well Eat regular healthy meals Stay active Use relaxation techniques(deep breathing, meditating, yoga) Do Not substitute Alcohol to help with tapering If you have been on opioids for less than two weeks and do not have pain than it is ok to stop all together.  Plan to wean off of opioids This plan should start within one week post op of your joint replacement. Maintain the same interval or time between  taking each dose and first decrease the dose.  Cut the total daily intake of opioids by one tablet each day Next start to increase the time between doses. The last dose that should be eliminated is the evening dose.      TED hose   Complete by: As directed    Use stockings (TED hose) for three weeks on both leg(s).  You may remove them at night for sleeping.   Weight bearing as tolerated   Complete by: As directed         Follow-up Information     Aluisio, Dempsey, MD. Schedule an appointment as soon as possible for a visit in 2 week(s).   Specialty: Orthopedic Surgery Contact information: [address] [place] [address] [phone]                  Signed: Roxie Mess 05/19/2024, 1:25 PM

## 2024-05-31 ENCOUNTER — Other Ambulatory Visit: Payer: Self-pay
